# Patient Record
Sex: Male | Born: 1945 | Race: White | Hispanic: No | Marital: Married | Smoking: Never smoker
Health system: Southern US, Community
[De-identification: ages and names within clinical notes are randomized; demographics above are authoritative.]

## PROBLEM LIST (undated history)

## (undated) DIAGNOSIS — R351 Nocturia: Secondary | ICD-10-CM

## (undated) DIAGNOSIS — T66XXXA Radiation sickness, unspecified, initial encounter: Secondary | ICD-10-CM

## (undated) DIAGNOSIS — R339 Retention of urine, unspecified: Secondary | ICD-10-CM

## (undated) DIAGNOSIS — N390 Urinary tract infection, site not specified: Secondary | ICD-10-CM

## (undated) DIAGNOSIS — I10 Essential (primary) hypertension: Secondary | ICD-10-CM

## (undated) DIAGNOSIS — M869 Osteomyelitis, unspecified: Secondary | ICD-10-CM

## (undated) DIAGNOSIS — Z9889 Other specified postprocedural states: Secondary | ICD-10-CM

## (undated) DIAGNOSIS — C61 Malignant neoplasm of prostate: Secondary | ICD-10-CM

## (undated) DIAGNOSIS — Z87442 Personal history of urinary calculi: Secondary | ICD-10-CM

## (undated) DIAGNOSIS — M1990 Unspecified osteoarthritis, unspecified site: Secondary | ICD-10-CM

## (undated) HISTORY — PX: WISDOM TOOTH EXTRACTION: SHX21

## (undated) HISTORY — PX: SUPRAPUBIC CATHETER PLACEMENT: SHX2473

## (undated) HISTORY — PX: OTHER SURGICAL HISTORY: SHX169

## (undated) HISTORY — PX: TRANSURETHRAL RESECTION OF PROSTATE: SHX73

---

## 2006-08-30 HISTORY — PX: RADIOACTIVE SEED IMPLANT: SHX5150

## 2006-08-30 HISTORY — PX: OTHER SURGICAL HISTORY: SHX169

## 2007-03-07 ENCOUNTER — Ambulatory Visit (HOSPITAL_COMMUNITY): Admission: RE | Admit: 2007-03-07 | Discharge: 2007-03-07 | Payer: Self-pay | Admitting: Urology

## 2007-03-14 ENCOUNTER — Ambulatory Visit: Admission: RE | Admit: 2007-03-14 | Discharge: 2007-06-12 | Payer: Self-pay | Admitting: Radiation Oncology

## 2007-06-13 ENCOUNTER — Ambulatory Visit: Admission: RE | Admit: 2007-06-13 | Discharge: 2007-08-28 | Payer: Self-pay | Admitting: Radiation Oncology

## 2007-07-20 ENCOUNTER — Encounter: Admission: RE | Admit: 2007-07-20 | Discharge: 2007-07-20 | Payer: Self-pay | Admitting: General Surgery

## 2007-08-01 ENCOUNTER — Encounter: Admission: RE | Admit: 2007-08-01 | Discharge: 2007-08-01 | Payer: Self-pay | Admitting: Urology

## 2007-08-07 ENCOUNTER — Ambulatory Visit (HOSPITAL_BASED_OUTPATIENT_CLINIC_OR_DEPARTMENT_OTHER): Admission: RE | Admit: 2007-08-07 | Discharge: 2007-08-07 | Payer: Self-pay | Admitting: Urology

## 2007-09-01 ENCOUNTER — Ambulatory Visit: Admission: RE | Admit: 2007-09-01 | Discharge: 2007-09-24 | Payer: Self-pay | Admitting: Radiation Oncology

## 2007-09-05 ENCOUNTER — Encounter: Admission: RE | Admit: 2007-09-05 | Discharge: 2007-09-05 | Payer: Self-pay | Admitting: General Surgery

## 2009-08-30 HISTORY — PX: TOE SURGERY: SHX1073

## 2010-12-08 ENCOUNTER — Other Ambulatory Visit: Payer: Self-pay | Admitting: Urology

## 2010-12-08 DIAGNOSIS — C61 Malignant neoplasm of prostate: Secondary | ICD-10-CM

## 2010-12-21 ENCOUNTER — Encounter (HOSPITAL_COMMUNITY)
Admission: RE | Admit: 2010-12-21 | Discharge: 2010-12-21 | Disposition: A | Discharge status: Home / Self Care | Payer: Managed Care, Other (non HMO) | Source: Ambulatory Visit | Attending: Urology | Admitting: Urology

## 2010-12-21 ENCOUNTER — Encounter (HOSPITAL_COMMUNITY): Payer: Self-pay

## 2010-12-21 DIAGNOSIS — M47817 Spondylosis without myelopathy or radiculopathy, lumbosacral region: Secondary | ICD-10-CM | POA: Insufficient documentation

## 2010-12-21 DIAGNOSIS — M899 Disorder of bone, unspecified: Secondary | ICD-10-CM | POA: Insufficient documentation

## 2010-12-21 DIAGNOSIS — M1610 Unilateral primary osteoarthritis, unspecified hip: Secondary | ICD-10-CM | POA: Insufficient documentation

## 2010-12-21 DIAGNOSIS — C61 Malignant neoplasm of prostate: Secondary | ICD-10-CM

## 2010-12-21 DIAGNOSIS — M25559 Pain in unspecified hip: Secondary | ICD-10-CM | POA: Insufficient documentation

## 2010-12-21 DIAGNOSIS — M169 Osteoarthritis of hip, unspecified: Secondary | ICD-10-CM | POA: Insufficient documentation

## 2010-12-21 DIAGNOSIS — M898X9 Other specified disorders of bone, unspecified site: Secondary | ICD-10-CM | POA: Insufficient documentation

## 2010-12-21 MED ORDER — TECHNETIUM TC 99M MEDRONATE IV KIT
25.0000 | PACK | Freq: Once | INTRAVENOUS | Status: AC | PRN
  Administered 2010-12-21: 24 via INTRAVENOUS

## 2011-01-12 NOTE — Op Note (Signed)
NAMEJamol Stafford, [name]                  ACCOUNT NO.:  [id number]   MEDICAL RECORD NO.:  [id number]          PATIENT TYPE:  AMB   LOCATION:  [facility]                         FACILITY:  [facility]   PHYSICIAN:  Bertram Millard. Dahlstedt, M.D.DATE OF BIRTH:  [birth year]   DATE OF PROCEDURE:  08/07/2007  DATE OF DISCHARGE:                               OPERATIVE REPORT   PREOPERATIVE DIAGNOSIS:  Adenocarcinoma of the prostate, clinical stage  T2C, Gleason 4+4.   POSTOPERATIVE DIAGNOSIS:  Adenocarcinoma of the prostate, clinical stage  T2C, Gleason 4+4.   PROCEDURE:  Placement I-125 brachytherapy/seed implantation.   SURGEON:  Bertram Millard. Dahlstedt, M.D.   RADIATION ONCOLOGIST:  Artist Pais. Kathrynn Running, M.D.   ANESTHESIA:  General with LMA.   COMPLICATIONS:  None.   ESTIMATED BLOOD LOSS:  Minimal.   DISPOSITION:  To PACU.   BRIEF HISTORY:  Kevin Stafford is a 65 year old male with adenocarcinoma of the  prostate.  He first presented in the summer with an elevated PSA of 55.  He had a nodular gland and biopsies revealed cancer mainly on the right  side of his gland, with one small focus on the left.  He had Gleason 8  (4+4).  He chose combination radiotherapy with prolonged administration  of Lupron.  He has completed his external beam radiotherapy and presents  at this time for I-125 brachytherapy.  He understands the procedure as  well as the risks and complications.  He desires to proceed.   DESCRIPTION OF PROCEDURE:  The patient was administered preoperative IV  antibiotics in the holding area, identified there, taken to the  operating room, where a general anesthetic was administered using the  LMA.  He was placed in the dorsal lithotomy position.  A transrectal  ultrasound probe was placed by Dr. Kathrynn Running.  Serial scans were then  taken of the prostate.  The prostate was outlined, the rectum identified  as well as the urethra.  Following this, the brachytherapy template was  placed on the ultrasound stand,  and fixing needles were placed.  Following computation by the Nucletron device, a total of 61 seeds were  placed in the prostate.  Twenty needles were utilized for this.  For  specifics, please see the sonographic planning of oncology treatment  sheet.  Following administration of all seeds, the fixation needles were  removed.  A hard-copy fluoroscopic image was then obtained.  The  catheter was then removed.  The ultrasound probe was removed and the  cystoscopy revealed no evident damage to the  urethra, no damage to the bladder, and no intraurethral or intravesical  seeds.  A minimal amount of water was left in the bladder.  The scope  was removed.  At this point the procedure was terminated.  A dressing  was placed on the patient's perineum.  A catheter was not replaced.  He  was awakened and taken to PACU in stable condition.      Bertram Millard. Dahlstedt, M.D.  Electronically Signed     SMD/MEDQ  D:  08/07/2007  T:  08/07/2007  Job:  [id number]   cc:  Artist Pais Kathrynn Running, M.D.  Fax: [phone]   [facility]

## 2011-02-19 ENCOUNTER — Ambulatory Visit (HOSPITAL_BASED_OUTPATIENT_CLINIC_OR_DEPARTMENT_OTHER)
Admission: RE | Admit: 2011-02-19 | Discharge: 2011-02-19 | Disposition: A | Discharge status: Home / Self Care | Payer: Managed Care, Other (non HMO) | Source: Ambulatory Visit | Attending: Urology | Admitting: Urology

## 2011-02-19 DIAGNOSIS — C61 Malignant neoplasm of prostate: Secondary | ICD-10-CM | POA: Insufficient documentation

## 2011-02-19 DIAGNOSIS — Z01812 Encounter for preprocedural laboratory examination: Secondary | ICD-10-CM | POA: Insufficient documentation

## 2011-02-19 DIAGNOSIS — Z01810 Encounter for preprocedural cardiovascular examination: Secondary | ICD-10-CM | POA: Insufficient documentation

## 2011-02-19 HISTORY — PX: CRYOABLATION: SHX1415

## 2011-02-19 HISTORY — PX: PROSTATE CRYOABLATION: SUR358

## 2011-02-19 LAB — POCT HEMOGLOBIN-HEMACUE: Hemoglobin: 16.7 g/dL (ref 13.0–17.0)

## 2011-05-02 ENCOUNTER — Emergency Department (HOSPITAL_COMMUNITY)
Admission: EM | Admit: 2011-05-02 | Discharge: 2011-05-02 | Disposition: A | Discharge status: Home / Self Care | Payer: Managed Care, Other (non HMO) | Attending: Emergency Medicine | Admitting: Emergency Medicine

## 2011-05-02 ENCOUNTER — Emergency Department (HOSPITAL_COMMUNITY): Payer: Managed Care, Other (non HMO)

## 2011-05-02 DIAGNOSIS — R109 Unspecified abdominal pain: Secondary | ICD-10-CM | POA: Insufficient documentation

## 2011-05-02 DIAGNOSIS — N390 Urinary tract infection, site not specified: Secondary | ICD-10-CM | POA: Insufficient documentation

## 2011-05-02 DIAGNOSIS — Z8546 Personal history of malignant neoplasm of prostate: Secondary | ICD-10-CM | POA: Insufficient documentation

## 2011-05-02 LAB — CBC
HCT: 40.1 % (ref 39.0–52.0)
MCV: 87.9 fL (ref 78.0–100.0)
Platelets: 224 10*3/uL (ref 150–400)
WBC: 9.9 10*3/uL (ref 4.0–10.5)

## 2011-05-02 LAB — POCT I-STAT, CHEM 8
Calcium, Ion: 1.14 mmol/L (ref 1.12–1.32)
Chloride: 109 mEq/L (ref 96–112)
Creatinine, Ser: 2.4 mg/dL — ABNORMAL HIGH (ref 0.50–1.35)
Glucose, Bld: 85 mg/dL (ref 70–99)
Hemoglobin: 13.6 g/dL (ref 13.0–17.0)
Potassium: 4 mEq/L (ref 3.5–5.1)
Sodium: 141 mEq/L (ref 135–145)
TCO2: 22 mmol/L (ref 0–100)

## 2011-05-02 LAB — DIFFERENTIAL
Basophils Relative: 0 % (ref 0–1)
Monocytes Absolute: 0.7 10*3/uL (ref 0.1–1.0)
Monocytes Relative: 7 % (ref 3–12)
Neutrophils Relative %: 88 % — ABNORMAL HIGH (ref 43–77)

## 2011-05-02 LAB — OCCULT BLOOD, POC DEVICE: Fecal Occult Bld: POSITIVE

## 2011-05-02 LAB — URINALYSIS, ROUTINE W REFLEX MICROSCOPIC
Ketones, ur: NEGATIVE mg/dL
pH: 6 (ref 5.0–8.0)

## 2011-05-04 LAB — URINE CULTURE
Colony Count: NO GROWTH
Culture: NO GROWTH

## 2011-06-07 LAB — CBC
HCT: 38 — ABNORMAL LOW
MCHC: 35.2
MCV: 87.8
Platelets: 172
WBC: 4.2

## 2011-06-07 LAB — COMPREHENSIVE METABOLIC PANEL
ALT: 31
Alkaline Phosphatase: 62
Calcium: 9.2
Creatinine, Ser: 0.95
GFR calc Af Amer: >60
Glucose, Bld: 108 — ABNORMAL HIGH
Potassium: 4.2
Sodium: 141

## 2011-06-07 LAB — APTT: aPTT: 26

## 2011-06-07 LAB — PROTIME-INR: INR: 1

## 2011-08-16 ENCOUNTER — Other Ambulatory Visit: Payer: Self-pay | Admitting: Urology

## 2011-09-14 ENCOUNTER — Encounter (HOSPITAL_BASED_OUTPATIENT_CLINIC_OR_DEPARTMENT_OTHER): Payer: Self-pay | Admitting: *Deleted

## 2011-09-14 NOTE — H&P (Signed)
Urology Admission H&P  Chief Complaint: Difficulty urinating  History of Present Illness:   Kevin Stafford is a 66 year old male who presents at this time for anesthetic cystoscopy. He has a history of adenocarcinoma prostate, with high-risk disease which was treated with combination radiotherapy and hormonal ablation in the past. His PSA increased in 2012, and he underwent repeat ultrasound and biopsy. This revealed persistent disease and his prostate. There was no evidence of extraprostatic spread. He underwent cryoablation, performed by Dr. Brunilda Payor in the summer of 2012. He has had significant lower urinary tract symptomatology since that time. Evaluation included urodynamics which revealed fairly small capacity bladder with a very poor urinary flow. Cystoscopy prior to that revealed abnormal findings within the prosthetic urethra. Apparently, I was not able, in the office, to get into his bladder. CT of the abdomen and pelvis revealed no hydronephrosis, which was different than his CT scan in September, 2 months after his cryoablation. That revealed mild right hydronephrosis, which was present when he presented to the emergency room with flank pain. There was no extraprostatic extravasation of contrast on that CT scan.  He presents at this time for anesthetic cystoscopy and possible incision of bladder neck/prostate 2 his voiding.  Past Medical History  Diagnosis Date  . Nocturia   . Recurrent UTI   . Prostate cancer S/P RADIOACTIVE SEED IMPLANTS 2008 AND CRYOABLATION OF PROSTATE 06-222012   Past Surgical History  Procedure Date  . Radioactive seed implant 2008    PROSTATE  . Reduction right hand fx 2008  . Wisdom tooth extraction [place]  . Prostate cryoablation 02-19-2011    Home Medications:  No prescriptions prior to admission   Allergies: No Known Allergies  History reviewed. No pertinent family history. Social History:  reports that he has never smoked. He has never used smokeless  tobacco. He reports that he drinks alcohol. He reports that he does not use illicit drugs.  Review of Systems  Constitutional: Negative.   HENT: Negative.   Eyes: Negative.   Respiratory: Negative.   Cardiovascular: Negative.   Gastrointestinal: Negative.   Genitourinary: Negative.  Negative for hematuria.       Difficult urination. Perineal pain.  Musculoskeletal: Negative.   Skin: Negative.   Neurological: Negative.   Endo/Heme/Allergies: Negative.   Psychiatric/Behavioral: Negative.     Physical Exam:  Vital signs in last 24 hours: Weight:  [90.719 kg (200 lb)] 90.719 kg (200 lb) (01/15 1154) Physical Exam  Constitutional: He is oriented to person, place, and time. He appears well-developed and well-nourished.  HENT:  Head: Normocephalic and atraumatic.  Eyes: Pupils are equal, round, and reactive to light.  Neck: Normal range of motion. Neck supple.  Cardiovascular: Normal rate, regular rhythm and normal heart sounds.   Respiratory: Effort normal and breath sounds normal.  GI: Soft. Bowel sounds are normal.  Genitourinary: Rectum normal and penis normal.  Musculoskeletal: Normal range of motion.  Neurological: He is alert and oriented to person, place, and time.  Skin: Skin is warm and dry.  Psychiatric: He has a normal mood and affect. His behavior is normal.    Laboratory Data:  No results found for this or any previous visit (from the past 24 hour(s)). No results found for this or any previous visit (from the past 240 hour(s)). Creatinine: No results found for this basename: CREATININE:7 in the last 168 hours Baseline Creatinine:   Impression/Assessment:  Adenocarcinoma of the prostate. Status post combination radiotherapy with androgen ablation. He was found to  have recurrence in 2012, and underwent cryoablation in the summer of 2012. He has had difficult urination, feeling of incomplete emptying, and slow urinary stream. There is evidence of significant loss of  prosthetic tissue. He presents at this time for anesthetic cystoscopy, possible TUIP versus TURP.  Plan:  See above  Chelsea Aus 09/14/2011, 9:25 PM

## 2011-09-14 NOTE — Progress Notes (Signed)
NPO AFTER MN. ARRIVES AT 0930. NEEDS HG. CURRENT 02-19-11 W/ CHART.

## 2011-09-15 ENCOUNTER — Encounter (HOSPITAL_BASED_OUTPATIENT_CLINIC_OR_DEPARTMENT_OTHER): Payer: Self-pay | Admitting: *Deleted

## 2011-09-15 ENCOUNTER — Encounter (HOSPITAL_BASED_OUTPATIENT_CLINIC_OR_DEPARTMENT_OTHER)
Admission: RE | Disposition: A | Discharge status: Home / Self Care | Payer: Self-pay | Source: Ambulatory Visit | Attending: Urology

## 2011-09-15 ENCOUNTER — Encounter (HOSPITAL_BASED_OUTPATIENT_CLINIC_OR_DEPARTMENT_OTHER): Payer: Self-pay | Admitting: Certified Registered"

## 2011-09-15 ENCOUNTER — Ambulatory Visit (HOSPITAL_BASED_OUTPATIENT_CLINIC_OR_DEPARTMENT_OTHER)
Admission: RE | Admit: 2011-09-15 | Discharge: 2011-09-15 | Disposition: A | Discharge status: Home / Self Care | Payer: Managed Care, Other (non HMO) | Source: Ambulatory Visit | Attending: Urology | Admitting: Urology

## 2011-09-15 ENCOUNTER — Ambulatory Visit (HOSPITAL_BASED_OUTPATIENT_CLINIC_OR_DEPARTMENT_OTHER): Payer: Managed Care, Other (non HMO) | Admitting: Certified Registered"

## 2011-09-15 DIAGNOSIS — R339 Retention of urine, unspecified: Secondary | ICD-10-CM | POA: Insufficient documentation

## 2011-09-15 DIAGNOSIS — R39198 Other difficulties with micturition: Secondary | ICD-10-CM | POA: Insufficient documentation

## 2011-09-15 DIAGNOSIS — C61 Malignant neoplasm of prostate: Secondary | ICD-10-CM | POA: Insufficient documentation

## 2011-09-15 DIAGNOSIS — Z923 Personal history of irradiation: Secondary | ICD-10-CM | POA: Insufficient documentation

## 2011-09-15 HISTORY — DX: Urinary tract infection, site not specified: N39.0

## 2011-09-15 HISTORY — DX: Malignant neoplasm of prostate: C61

## 2011-09-15 HISTORY — PX: CYSTOSCOPY: SHX5120

## 2011-09-15 HISTORY — DX: Nocturia: R35.1

## 2011-09-15 SURGERY — CYSTOSCOPY
Anesthesia: General | Site: Ureter | Wound class: Clean Contaminated

## 2011-09-15 MED ORDER — OXYCODONE-ACETAMINOPHEN 5-325 MG PO TABS
1.0000 | ORAL_TABLET | ORAL | Status: DC | PRN
  Administered 2011-09-15: 2 via ORAL

## 2011-09-15 MED ORDER — CEFAZOLIN SODIUM-DEXTROSE 2-3 GM-% IV SOLR
2.0000 g | INTRAVENOUS | Status: AC
  Administered 2011-09-15: 2 g via INTRAVENOUS

## 2011-09-15 MED ORDER — SODIUM CHLORIDE 0.9 % IR SOLN: Status: DC | PRN

## 2011-09-15 MED ORDER — PROMETHAZINE HCL 25 MG/ML IJ SOLN: 6.2500 mg | INTRAMUSCULAR | Status: DC | PRN

## 2011-09-15 MED ORDER — PROPOFOL 10 MG/ML IV EMUL
INTRAVENOUS | Status: DC | PRN
  Administered 2011-09-15: 200 mg via INTRAVENOUS

## 2011-09-15 MED ORDER — CEFAZOLIN SODIUM 1-5 GM-% IV SOLN: 1.0000 g | INTRAVENOUS | Status: DC

## 2011-09-15 MED ORDER — LIDOCAINE HCL (CARDIAC) 20 MG/ML IV SOLN
INTRAVENOUS | Status: DC | PRN
  Administered 2011-09-15: 100 mg via INTRAVENOUS

## 2011-09-15 MED ORDER — FENTANYL CITRATE 0.05 MG/ML IJ SOLN: 25.0000 ug | INTRAMUSCULAR | Status: DC | PRN

## 2011-09-15 MED ORDER — LACTATED RINGERS IV SOLN
INTRAVENOUS | Status: DC
  Administered 2011-09-15: 100 mL/h via INTRAVENOUS
  Administered 2011-09-15: 14:00:00 via INTRAVENOUS

## 2011-09-15 MED ORDER — DEXAMETHASONE SODIUM PHOSPHATE 4 MG/ML IJ SOLN
INTRAMUSCULAR | Status: DC | PRN
  Administered 2011-09-15: 8 mg via INTRAVENOUS

## 2011-09-15 MED ORDER — STERILE WATER FOR IRRIGATION IR SOLN
Status: DC | PRN
  Administered 2011-09-15: 3000 mL

## 2011-09-15 MED ORDER — BELLADONNA ALKALOIDS-OPIUM 16.2-60 MG RE SUPP
RECTAL | Status: DC | PRN
  Administered 2011-09-15: 1 via RECTAL

## 2011-09-15 MED ORDER — MIDAZOLAM HCL 5 MG/5ML IJ SOLN
INTRAMUSCULAR | Status: DC | PRN
  Administered 2011-09-15: 2 mg via INTRAVENOUS

## 2011-09-15 MED ORDER — FENTANYL CITRATE 0.05 MG/ML IJ SOLN
INTRAMUSCULAR | Status: DC | PRN
  Administered 2011-09-15 (×2): 25 ug via INTRAVENOUS
  Administered 2011-09-15 (×2): 50 ug via INTRAVENOUS
  Administered 2011-09-15 (×2): 25 ug via INTRAVENOUS
  Administered 2011-09-15: 50 ug via INTRAVENOUS

## 2011-09-15 MED ORDER — DIATRIZOATE MEGLUMINE 30 % UR SOLN
URETHRAL | Status: DC | PRN
  Administered 2011-09-15: 300 mL

## 2011-09-15 SURGICAL SUPPLY — 41 items
BAG DRAIN URO-CYSTO SKYTR STRL (DRAIN) ×3 IMPLANT
BAG URINE DRAINAGE (UROLOGICAL SUPPLIES) IMPLANT
BAG URINE LEG 19OZ MD ST LTX (BAG) IMPLANT
CANISTER SUCT LVC 12 LTR MEDI- (MISCELLANEOUS) ×3 IMPLANT
CATH COUDE FOLEY 2W 5CC 18FR (CATHETERS) ×3 IMPLANT
CATH FOLEY 2WAY SLVR  5CC 20FR (CATHETERS) ×1
CATH FOLEY 2WAY SLVR  5CC 22FR (CATHETERS)
CATH FOLEY 2WAY SLVR 30CC 22FR (CATHETERS) IMPLANT
CATH FOLEY 2WAY SLVR 5CC 20FR (CATHETERS) ×2 IMPLANT
CATH FOLEY 2WAY SLVR 5CC 22FR (CATHETERS) IMPLANT
CATH FOLEY 3WAY 30CC 22F (CATHETERS) IMPLANT
CATH HEMA 3WAY 30CC 24FR COUDE (CATHETERS) IMPLANT
CATH HEMA 3WAY 30CC 24FR RND (CATHETERS) IMPLANT
CLOTH BEACON ORANGE TIMEOUT ST (SAFETY) ×3 IMPLANT
DRAPE CAMERA CLOSED 9X96 (DRAPES) ×3 IMPLANT
ELECT BUTTON BIOP 24F 90D PLAS (MISCELLANEOUS) IMPLANT
ELECT BUTTON HF 24-28F 2 30DE (ELECTRODE) IMPLANT
ELECT LOOP HF 26F 30D .35MM (CUTTING LOOP) IMPLANT
ELECT NEEDLE 45D HF 24-28F 12D (CUTTING LOOP) IMPLANT
ELECT REM PT RETURN 9FT ADLT (ELECTROSURGICAL) ×3
ELECTRODE REM PT RTRN 9FT ADLT (ELECTROSURGICAL) ×2 IMPLANT
EVACUATOR MICROVAS BLADDER (UROLOGICAL SUPPLIES) IMPLANT
GLOVE BIO SURGEON STRL SZ8 (GLOVE) ×3 IMPLANT
GLOVE BIOGEL PI IND STRL 6.5 (GLOVE) ×2 IMPLANT
GLOVE BIOGEL PI INDICATOR 6.5 (GLOVE) ×1
GLOVE ECLIPSE 6.5 STRL STRAW (GLOVE) ×3 IMPLANT
GOWN STRL REIN XL XLG (GOWN DISPOSABLE) ×3 IMPLANT
GOWN SURGICAL LARGE (GOWNS) ×6 IMPLANT
GOWN XL W/COTTON TOWEL STD (GOWNS) ×3 IMPLANT
GUIDEWIRE ANG ZIPWIRE 038X150 (WIRE) ×3 IMPLANT
GUIDEWIRE STR DUAL SENSOR (WIRE) ×3 IMPLANT
HOLDER FOLEY CATH W/STRAP (MISCELLANEOUS) IMPLANT
KIT ASPIRATION TUBING (SET/KITS/TRAYS/PACK) IMPLANT
LOOP CUTTING 24FR OLYMPUS (CUTTING LOOP) IMPLANT
NDL SAFETY ECLIPSE 18X1.5 (NEEDLE) ×2 IMPLANT
NEEDLE HYPO 18GX1.5 SHARP (NEEDLE) ×1
PACK CYSTOSCOPY (CUSTOM PROCEDURE TRAY) ×3 IMPLANT
PLUG CATH AND CAP STER (CATHETERS) IMPLANT
SYR 30ML LL (SYRINGE) IMPLANT
SYRINGE IRR TOOMEY STRL 70CC (SYRINGE) ×3 IMPLANT
WATER STERILE IRR 3000ML UROMA (IV SOLUTION) ×6 IMPLANT

## 2011-09-15 NOTE — Anesthesia Postprocedure Evaluation (Signed)
  Anesthesia Post-op Note  Patient: Kevin Stafford  Procedure(s) Performed:  CYSTOSCOPY - cystogram  Patient Location: PACU  Anesthesia Type: General  Level of Consciousness: oriented and sedated  Airway and Oxygen Therapy: Patient Spontanous Breathing and Patient connected to nasal cannula oxygen  Post-op Pain: mild  Post-op Assessment: Post-op Vital signs reviewed, Patient's Cardiovascular Status Stable, Respiratory Function Stable and Patent Airway  Post-op Vital Signs: stable  Complications: No apparent anesthesia complications

## 2011-09-15 NOTE — Anesthesia Procedure Notes (Signed)
Procedure Name: LMA Insertion Date/Time: 09/15/2011 10:58 AM Performed by: Renella Cunas D Pre-anesthesia Checklist: Patient identified, Emergency Drugs available, Suction available and Patient being monitored Patient Re-evaluated:Patient Re-evaluated prior to inductionOxygen Delivery Method: Circle System Utilized Preoxygenation: Pre-oxygenation with 100% oxygen Intubation Type: IV induction Ventilation: Mask ventilation without difficulty LMA: LMA inserted LMA Size: 4.0 Number of attempts: 1 Airway Equipment and Method: bite block Placement Confirmation: positive ETCO2 Tube secured with: Tape Dental Injury: Teeth and Oropharynx as per pre-operative assessment

## 2011-09-15 NOTE — Progress Notes (Signed)
Received report as caregiver. 

## 2011-09-15 NOTE — Op Note (Signed)
Preoperative diagnosis: History of prostate cancer, status post radiotherapy and salvage cryoablation. Significant voiding dysfunction. Postoperative diagnosis: Same  Procedure: Anesthetic cystoscopy, cystogram Surgeon: Bertram Millard. Jeni Duling, M.D.  Anesthesia: Gen. With LMA Indications: This 66 year old gentleman comes today for cystoscopy. He has a history of radiotherapy for his prostate cancer several years ago, followed by salvage cryoablation in July of 2012. He has had significant voiding dysfunction since that time. Recent evaluation included urodynamics which revealed a small capacity bladder with a very poor urinary flow. Additionally, there is evidence of significant prostatic fossa with some contiguity with the bladder. Cystoscopy in the office revealed only necrotic tissue, and I do not think I negotiated the scope into the bladder. CT scan reveals a draining bladder, but a thickened bladder base.  He presents at this time for cystoscopy, and if present, treatment of the bladder neck contracture.     Technique and findings: The patient was identified in the holding area and received preoperative IV antibiotics. He was taken to the operating room where general anesthetic was administered with the LMA. He was placed in the dorsolithotomy position. Genitalia and perineum were prepped and draped. Time out was then performed. A 22 French cystoscope was advanced through his urethra under direct vision. The anterior urethra was normal. There was adequate coaptation of the urethra at the membranous area. Once within the prostatic urethra, there was loss of landmarks, and a significant amount of white, fluffy tissue circumferentially surrounding the prostatic fossa. Because I was unable to see the bladder neck, I then removed the scope, and tried placing an 16 French coud tip catheter. The catheter was negotiated as far as I could, and Cystografin was injected. It was evident that the catheter was  curled up within his prostatic urethra. There was some egress of the contrast and was bladder, but the majority of the contrast was within the prosthetic urethra. There did not seem to be any extravasation of the contrast in the surrounding tissue. I then removed the catheter, and passed the cystoscope again. It was quite difficult to see anything but the "fluffy" tissue surrounding the prostatic urethra. Despite having the water irrigation pressure, I was still unable to negotiate the bladder neck. I used a glide wire to attempt to negotiate the guidewire into the bladder through the bladder neck blindly. Unfortunately, even with fluoroscopic guidance, I was unable to do this. After approximately 20 minutes of surveying the prostatic urethra, I then backed the cystoscope up, and a bit more anteriorly. There was evidence that there was some normal urothelium in this area. I then advanced the scope slightly, and found that I was within the bladder. At this point, I passed a guidewire through the cystoscope, guaranteeing access in the future to the bladder. The bladder was inspected circumferentially. The urothelium was smooth, but there were evidence of small hemorrhagic areas consistent with prior radiation. I saw no tumors. I saw no significant trabeculations. The bladder base was somewhat irregular, and I was unable to see the ureteral orifices. I backed the cystoscope up, and observe the bladder neck. It was obvious at this point that the bladder neck was approximately twice as big as the cystoscope beak, and that there was no significant contracture. It was evident that the prosthetic fossa was underlying the bladder neck posteriorly. I did see a few iodine seeds loose within the prostatic fossa. These were not removed, but I tried to irrigate them out unsuccessfully. After inspecting the bladder neck, and feeling that  he did not need to have any incision of the bladder neck, I then removed the cystoscope. With  the guidewire in place, I advanced a 20 French Foley catheter over top the guidewire into the bladder. Proper catheter positioning was checked by injecting Cystografin through the catheter once a guidewire was removed. There was obvious filling of the bladder and up the prostatic fossa. At this point the balloon was filled, and the catheter left to drainage.The patient tolerated the procedure well. He was awakened and taken to the PACU in stable condition.

## 2011-09-15 NOTE — Progress Notes (Signed)
Glasses returned to patient.

## 2011-09-15 NOTE — Anesthesia Preprocedure Evaluation (Signed)
Anesthesia Evaluation  Patient identified by MRN, date of birth, ID band Patient awake    Reviewed: Allergy & Precautions, H&P , NPO status , Patient's Chart, lab work & pertinent test results, reviewed documented beta blocker date and time   Airway Mallampati: II TM Distance: >3 FB Neck ROM: Full    Dental  (+) Teeth Intact   Pulmonary neg pulmonary ROS,  clear to auscultation        Cardiovascular neg cardio ROS Regular Normal Denies cardiac symptoms   Neuro/Psych Negative Neurological ROS  Negative Psych ROS   GI/Hepatic negative GI ROS, Neg liver ROS,   Endo/Other  Negative Endocrine ROS  Renal/GU negative Renal ROS   Prostate cancer    Musculoskeletal negative musculoskeletal ROS (+)   Abdominal   Peds negative pediatric ROS (+)  Hematology negative hematology ROS (+)   Anesthesia Other Findings   Reproductive/Obstetrics negative OB ROS                           Anesthesia Physical  Anesthesia Plan  ASA: II  Anesthesia Plan: General   Post-op Pain Management:    Induction: Intravenous  Airway Management Planned: LMA  Additional Equipment:   Intra-op Plan:   Post-operative Plan: Extubation in OR  Informed Consent:   Plan Discussed with: CRNA and Surgeon  Anesthesia Plan Comments:         Anesthesia Quick Evaluation  

## 2011-09-15 NOTE — Transfer of Care (Signed)
Immediate Anesthesia Transfer of Care Note  Patient: Kevin Stafford  Procedure(s) Performed:  CYSTOSCOPY - cystogram  Patient Location: Patient transported to PACU with oxygen via face mask at 6 Liters / Min  Anesthesia Type: General  Level of Consciousness: awake and alert   Airway & Oxygen Therapy: Patient Spontanous Breathing and Patient connected to face mask oxygen Post-op Assessment: Report given to PACU RN and Post -op Vital signs reviewed and stable  Post vital signs: Reviewed and stable  Complications: No apparent anesthesia complications  Filed Vitals:   09/15/11 1158  BP: 141/87  Pulse: 80  Temp: 36.1 C  Resp: 17

## 2011-09-16 ENCOUNTER — Encounter (HOSPITAL_BASED_OUTPATIENT_CLINIC_OR_DEPARTMENT_OTHER): Payer: Self-pay | Admitting: Urology

## 2011-10-04 ENCOUNTER — Other Ambulatory Visit: Payer: Self-pay | Admitting: Urology

## 2011-10-05 ENCOUNTER — Encounter (HOSPITAL_BASED_OUTPATIENT_CLINIC_OR_DEPARTMENT_OTHER): Payer: Self-pay | Admitting: *Deleted

## 2011-10-05 NOTE — Progress Notes (Signed)
To [facility]  at 0815.NPO after MN-Hg on arrival.  Ekg with epic chart.

## 2011-10-11 ENCOUNTER — Ambulatory Visit (HOSPITAL_BASED_OUTPATIENT_CLINIC_OR_DEPARTMENT_OTHER): Payer: Managed Care, Other (non HMO) | Admitting: Anesthesiology

## 2011-10-11 ENCOUNTER — Encounter (HOSPITAL_BASED_OUTPATIENT_CLINIC_OR_DEPARTMENT_OTHER)
Admission: RE | Disposition: A | Discharge status: Home / Self Care | Payer: Self-pay | Source: Ambulatory Visit | Attending: Urology

## 2011-10-11 ENCOUNTER — Encounter (HOSPITAL_BASED_OUTPATIENT_CLINIC_OR_DEPARTMENT_OTHER): Payer: Self-pay | Admitting: Anesthesiology

## 2011-10-11 ENCOUNTER — Ambulatory Visit (HOSPITAL_BASED_OUTPATIENT_CLINIC_OR_DEPARTMENT_OTHER)
Admission: RE | Admit: 2011-10-11 | Discharge: 2011-10-11 | Disposition: A | Discharge status: Home / Self Care | Payer: Managed Care, Other (non HMO) | Source: Ambulatory Visit | Attending: Urology | Admitting: Urology

## 2011-10-11 ENCOUNTER — Encounter (HOSPITAL_BASED_OUTPATIENT_CLINIC_OR_DEPARTMENT_OTHER): Payer: Self-pay | Admitting: *Deleted

## 2011-10-11 DIAGNOSIS — R351 Nocturia: Secondary | ICD-10-CM | POA: Insufficient documentation

## 2011-10-11 DIAGNOSIS — R300 Dysuria: Secondary | ICD-10-CM | POA: Insufficient documentation

## 2011-10-11 DIAGNOSIS — N3289 Other specified disorders of bladder: Secondary | ICD-10-CM | POA: Insufficient documentation

## 2011-10-11 DIAGNOSIS — C61 Malignant neoplasm of prostate: Secondary | ICD-10-CM

## 2011-10-11 DIAGNOSIS — R350 Frequency of micturition: Secondary | ICD-10-CM | POA: Insufficient documentation

## 2011-10-11 DIAGNOSIS — R339 Retention of urine, unspecified: Secondary | ICD-10-CM | POA: Insufficient documentation

## 2011-10-11 HISTORY — PX: CYSTOSCOPY: SHX5120

## 2011-10-11 HISTORY — DX: Retention of urine, unspecified: R33.9

## 2011-10-11 LAB — POCT HEMOGLOBIN-HEMACUE: Hemoglobin: 13.9 g/dL (ref 13.0–17.0)

## 2011-10-11 SURGERY — INSERTION, SUPRAPUBIC CATHETER
Anesthesia: General | Site: Bladder

## 2011-10-11 MED ORDER — MIDAZOLAM HCL 5 MG/5ML IJ SOLN
INTRAMUSCULAR | Status: DC | PRN
  Administered 2011-10-11: 2 mg via INTRAVENOUS

## 2011-10-11 MED ORDER — KETOROLAC TROMETHAMINE 30 MG/ML IJ SOLN
30.0000 mg | Freq: Once | INTRAMUSCULAR | Status: AC
  Administered 2011-10-11: 30 mg via INTRAVENOUS

## 2011-10-11 MED ORDER — CEFAZOLIN SODIUM 1-5 GM-% IV SOLN: 1.0000 g | INTRAVENOUS | Status: DC

## 2011-10-11 MED ORDER — ONDANSETRON HCL 4 MG/2ML IJ SOLN
INTRAMUSCULAR | Status: DC | PRN
  Administered 2011-10-11: 4 mg via INTRAVENOUS

## 2011-10-11 MED ORDER — FENTANYL CITRATE 0.05 MG/ML IJ SOLN
INTRAMUSCULAR | Status: DC | PRN
  Administered 2011-10-11 (×3): 50 ug via INTRAVENOUS
  Administered 2011-10-11 (×2): 25 ug via INTRAVENOUS
  Administered 2011-10-11 (×2): 50 ug via INTRAVENOUS

## 2011-10-11 MED ORDER — PROMETHAZINE HCL 25 MG/ML IJ SOLN: 12.5000 mg | Freq: Four times a day (QID) | INTRAMUSCULAR | Status: DC | PRN

## 2011-10-11 MED ORDER — BELLADONNA ALKALOIDS-OPIUM 16.2-60 MG RE SUPP
1.0000 | Freq: Once | RECTAL | Status: AC
  Administered 2011-10-11: 1 via RECTAL

## 2011-10-11 MED ORDER — FENTANYL CITRATE 0.05 MG/ML IJ SOLN
25.0000 ug | INTRAMUSCULAR | Status: DC | PRN
  Administered 2011-10-11 (×2): 25 ug via INTRAVENOUS

## 2011-10-11 MED ORDER — STERILE WATER FOR IRRIGATION IR SOLN
Status: DC | PRN
  Administered 2011-10-11: 3000 mL

## 2011-10-11 MED ORDER — LACTATED RINGERS IV SOLN
INTRAVENOUS | Status: DC
  Administered 2011-10-11 (×2): via INTRAVENOUS

## 2011-10-11 MED ORDER — LIDOCAINE HCL (CARDIAC) 20 MG/ML IV SOLN
INTRAVENOUS | Status: DC | PRN
  Administered 2011-10-11: 100 mg via INTRAVENOUS

## 2011-10-11 MED ORDER — OXYCODONE HCL 5 MG PO TABS: 5.0000 mg | ORAL_TABLET | ORAL | Status: DC | PRN

## 2011-10-11 MED ORDER — ACETAMINOPHEN 325 MG PO TABS: 650.0000 mg | ORAL_TABLET | ORAL | Status: DC | PRN

## 2011-10-11 MED ORDER — PROPOFOL 10 MG/ML IV EMUL
INTRAVENOUS | Status: DC | PRN
  Administered 2011-10-11: 230 mg via INTRAVENOUS

## 2011-10-11 MED ORDER — ACETAMINOPHEN 650 MG RE SUPP: 650.0000 mg | RECTAL | Status: DC | PRN

## 2011-10-11 MED ORDER — DEXAMETHASONE SODIUM PHOSPHATE 4 MG/ML IJ SOLN
INTRAMUSCULAR | Status: DC | PRN
  Administered 2011-10-11: 10 mg via INTRAVENOUS

## 2011-10-11 MED ORDER — LACTATED RINGERS IV SOLN
INTRAVENOUS | Status: DC
  Administered 2011-10-11: 12:00:00 via INTRAVENOUS

## 2011-10-11 MED ORDER — PROMETHAZINE HCL 25 MG/ML IJ SOLN: 6.2500 mg | INTRAMUSCULAR | Status: DC | PRN

## 2011-10-11 MED ORDER — OXYCODONE-ACETAMINOPHEN 5-325 MG PO TABS
1.0000 | ORAL_TABLET | Freq: Once | ORAL | Status: AC
  Administered 2011-10-11: 1 via ORAL

## 2011-10-11 MED ORDER — MEPERIDINE HCL 25 MG/ML IJ SOLN: 6.2500 mg | INTRAMUSCULAR | Status: DC | PRN

## 2011-10-11 MED ORDER — CEFAZOLIN SODIUM-DEXTROSE 2-3 GM-% IV SOLR
2.0000 g | INTRAVENOUS | Status: AC
  Administered 2011-10-11: 2 g via INTRAVENOUS

## 2011-10-11 MED ORDER — ONDANSETRON HCL 4 MG/2ML IJ SOLN: 4.0000 mg | Freq: Four times a day (QID) | INTRAMUSCULAR | Status: DC | PRN

## 2011-10-11 SURGICAL SUPPLY — 28 items
BAG DRAIN URO-CYSTO SKYTR STRL (DRAIN) ×3 IMPLANT
BLADE SURG 15 STRL LF DISP TIS (BLADE) ×2 IMPLANT
BLADE SURG 15 STRL SS (BLADE) ×1
CANISTER SUCT LVC 12 LTR MEDI- (MISCELLANEOUS) ×3 IMPLANT
CATH FOLEY 2WAY SLVR  5CC 16FR (CATHETERS)
CATH FOLEY 2WAY SLVR  5CC 18FR (CATHETERS) ×1
CATH FOLEY 2WAY SLVR 5CC 16FR (CATHETERS) IMPLANT
CATH FOLEY 2WAY SLVR 5CC 18FR (CATHETERS) ×2 IMPLANT
CLOTH BEACON ORANGE TIMEOUT ST (SAFETY) ×3 IMPLANT
DRAPE CAMERA CLOSED 9X96 (DRAPES) ×3 IMPLANT
ELECT REM PT RETURN 9FT ADLT (ELECTROSURGICAL) ×3
ELECTRODE REM PT RTRN 9FT ADLT (ELECTROSURGICAL) ×2 IMPLANT
GAUZE SPONGE 4X4 12PLY STRL LF (GAUZE/BANDAGES/DRESSINGS) ×3 IMPLANT
GLOVE BIO SURGEON STRL SZ8 (GLOVE) ×3 IMPLANT
GLOVE ECLIPSE 6.0 STRL STRAW (GLOVE) ×6 IMPLANT
GLOVE INDICATOR 6.5 STRL GRN (GLOVE) ×3 IMPLANT
GOWN PREVENTION PLUS LG XLONG (DISPOSABLE) ×3 IMPLANT
GOWN STRL REIN XL XLG (GOWN DISPOSABLE) ×3 IMPLANT
GOWN XL W/COTTON TOWEL STD (GOWNS) ×3 IMPLANT
HOLDER FOLEY CATH W/STRAP (MISCELLANEOUS) ×3 IMPLANT
NEEDLE HYPO 22GX1.5 SAFETY (NEEDLE) IMPLANT
NS IRRIG 500ML POUR BTL (IV SOLUTION) ×3 IMPLANT
PACK CYSTOSCOPY (CUSTOM PROCEDURE TRAY) ×3 IMPLANT
SUT SILK 2 0 30  PSL (SUTURE) ×1
SUT SILK 2 0 30 PSL (SUTURE) ×2 IMPLANT
SYRINGE IRR TOOMEY STRL 70CC (SYRINGE) ×3 IMPLANT
TAPE CLOTH SURG 6X10 WHT LF (GAUZE/BANDAGES/DRESSINGS) ×3 IMPLANT
WATER STERILE IRR 3000ML UROMA (IV SOLUTION) ×3 IMPLANT

## 2011-10-11 NOTE — Anesthesia Postprocedure Evaluation (Signed)
  Anesthesia Post-op Note  Patient: Kevin Stafford  Procedure(s) Performed:  INSERTION OF SUPRAPUBIC CATHETER - Placement of SP Tube  NO C-ARM Needed; CYSTOSCOPY  Patient Location: PACU  Anesthesia Type: General  Level of Consciousness: awake and alert   Airway and Oxygen Therapy: Patient Spontanous Breathing  Post-op Pain: mild  Post-op Assessment: Post-op Vital signs reviewed, Patient's Cardiovascular Status Stable, Respiratory Function Stable, Patent Airway and No signs of Nausea or vomiting  Post-op Vital Signs: stable  Complications: No apparent anesthesia complications

## 2011-10-11 NOTE — Transfer of Care (Signed)
Immediate Anesthesia Transfer of Care Note  Patient: Kevin Stafford  Procedure(s) Performed:  INSERTION OF SUPRAPUBIC CATHETER - Placement of SP Tube  NO C-ARM Needed; CYSTOSCOPY  Patient Location: PACU  Anesthesia Type: General  Level of Consciousness: drowsy, responds to name, follows commands  Airway & Oxygen Therapy: Patient Spontanous Breathing and Patient connected to face mask oxygen  Post-op Assessment: Report given to PACU RN and Post -op Vital signs reviewed and stable  Post vital signs: Reviewed and stable  Complications: No apparent anesthesia complications

## 2011-10-11 NOTE — Anesthesia Preprocedure Evaluation (Signed)
Anesthesia Evaluation  Patient identified by MRN, date of birth, ID band Patient awake    Reviewed: Allergy & Precautions, H&P , NPO status , Patient's Chart, lab work & pertinent test results, reviewed documented beta blocker date and time   Airway Mallampati: II TM Distance: >3 FB Neck ROM: Full    Dental  (+) Teeth Intact   Pulmonary neg pulmonary ROS,  clear to auscultation        Cardiovascular neg cardio ROS Regular Normal Denies cardiac symptoms   Neuro/Psych Negative Neurological ROS  Negative Psych ROS   GI/Hepatic negative GI ROS, Neg liver ROS,   Endo/Other  Negative Endocrine ROS  Renal/GU negative Renal ROS   Prostate cancer    Musculoskeletal negative musculoskeletal ROS (+)   Abdominal   Peds negative pediatric ROS (+)  Hematology negative hematology ROS (+)   Anesthesia Other Findings   Reproductive/Obstetrics negative OB ROS                           Anesthesia Physical  Anesthesia Plan  ASA: II  Anesthesia Plan: General   Post-op Pain Management:    Induction: Intravenous  Airway Management Planned: LMA  Additional Equipment:   Intra-op Plan:   Post-operative Plan: Extubation in OR  Informed Consent:   Plan Discussed with: CRNA and Surgeon  Anesthesia Plan Comments:         Anesthesia Quick Evaluation

## 2011-10-11 NOTE — H&P (Signed)
Urology Admission H&P  Chief Complaint: Difficulty urinating  History of Present Illness: Kevin Stafford is a 66 year old male with history of adenocarcinoma of the prostate, high risk category. He is status post radiotherapy approximately 3 years ago, with a bump in his PSA and a finding of recurrent disease in 2012. He underwent salvage cryoablation in June, 2012. Since that time, he has had difficulty urinating, and has had recent evidence of urethral/prostatic slough. He has had recent cystoscopy and urodynamics. This revealed a cavity where his prostate was, with no evidence of bladder neck obstruction. Despite this, the patient has had a difficult time after his catheter was removed. Because of the need for bladder drainage at this point, an incomplete bladder emptying, I recommended we place a suprapubic tube until his active prostatic process after his cryoablation has settled down. He presents at this time for Past Medical History  Diagnosis Date  . Nocturia   . Recurrent UTI   . Prostate cancer S/P RADIOACTIVE SEED IMPLANTS 2008 AND CRYOABLATION OF PROSTATE 06-222012  . Bladder retention of urine     foley since  09/27/11   Past Surgical History  Procedure Date  . Radioactive seed implant 2008    PROSTATE  . Reduction right hand fx 2008  . Wisdom tooth extraction IN [place]  . Prostate cryoablation 02-19-2011  . Cystoscopy 09/15/2011    Procedure: CYSTOSCOPY;  Surgeon: Marcine Matar, MD;  Location: [facility];  Service: Urology;;  cystogram    Home Medications:  No prescriptions prior to admission   Allergies: No Known Allergies  History reviewed. No pertinent family history. Social History:  reports that he has never smoked. He has never used smokeless tobacco. He reports that he drinks alcohol. He reports that he does not use illicit drugs.  Review of Systems  Constitutional: Negative.   HENT: Negative.   Eyes: Negative.   Respiratory: Negative.     Cardiovascular: Negative.   Gastrointestinal: Negative.   Genitourinary: Positive for dysuria and frequency.       He also has urinary retention, occasional cloudy urine.  Musculoskeletal: Positive for joint pain.  Skin: Negative.   Endo/Heme/Allergies: Negative.   All other systems reviewed and are negative.    Physical Exam:  Vital signs in last 24 hours:   Physical Exam  Constitutional: He appears well-developed and well-nourished.  HENT:  Head: Normocephalic and atraumatic.  Eyes: Pupils are equal, round, and reactive to light.  Cardiovascular: Normal rate.   Respiratory: Effort normal and breath sounds normal.  GI: Soft.  Genitourinary: Prostate normal and penis normal.  Musculoskeletal: Normal range of motion.  Neurological: He is alert.  Skin: Skin is warm.  Psychiatric: He has a normal mood and affect.    Laboratory Data:  No results found for this or any previous visit (from the past 24 hour(s)). No results found for this or any previous visit (from the past 240 hour(s)). Creatinine: No results found for this basename: CREATININE:7 in the last 168 hours Baseline Creatinine:   Impression/Assessment:  1. Incomplete emptying of bladder. This is secondary to, more likely, combination of noncompliant bladder with some evidence of bladder outlet/prosthetic obstruction.  2. Adenocarcinoma prostate. Status post radiotherapy and salvage cryoablation.  Plan:  At this point, he presents for cystoscopy and placement of suprapubic tube. I discussed the procedure with med and his wife. He desires to proceed.  Marcine Matar M 10/11/2011, 7:26 AM

## 2011-10-11 NOTE — Op Note (Signed)
Preoperative diagnosis: Inadequate bladder emptying Postoperative diagnosis: Same Procedure: Cystoscopy, placement of suprapubic tube Surgeon: Thersea Manfredonia Drains: 20 French council tip catheter Complications: None Anesthesia: Gen. with LMA  Indications: 66 year-old male with poor bladder emptying/retention, status post radiotherapy with salvage cryoablation. Cryoablation was performed approximately 8 months ago. He has had poor bladder emptying, intermittent retention, and at this point presents for suprapubic tube placement. He is aware of the risks and complications of the procedure. These include but are not limited to bleeding,: Injury, infection, among others. He understands these and desires to proceed.  Procedure: The patient was identified in the holding area and received preoperative IV antibiotics. He was taken the operating room where general anesthetic was administered with the LMA. He was placed in the dorsolithotomy position. Genitalia, perineum and lower abdomen were prepped and draped. Time out was performed.  The procedure commenced. A 22 French panendoscope was used to inspect the urethra which was normal. The prosthetic urethra was open, but there was significant necrotic tissue. The bladder was entered through the bladder neck which was enveloped by the necrotic tissue. The bladder was inspected. I did not see the ureteral orifices, but it was evident that the urothelium was somewhat erythematous. No tumors were seen. There were no stones. I filled the bladder with approximately 200 cc of saline. The bladder was somewhat diminished in capacity. I placed a Lowsley retractor, which easily passed into what I thought was the bladder. I used anterior traction to press up against the posterior abdominal wall. I felt that the retractor was adequately positioned. To verify this, I removed the retractor, and again cystoscoped the patient. There was a slightly erythematous area anteriorly where I  felt that there was minor trauma from the Lowsley retractor. The bladder was again filled, and I again placed a Lowsley retractor. I pressed anteriorly up against the anterior bladder wall/posterior abdominal wall. A cutdown over this with the Bovie. I was unable to deliver the tip of the retractor through the 1/2 cm abdominal incision. The 20 [place] council tip catheter was grasped and brought through the incision into the bladder. 10 cc of water was placed in the balloon once I thought that the catheter was adequately positioned. I then used a cystoscope to verify position of the catheter tip as well as a balloon. I then placed another 10 cc of water in the balloon, filling the balloon with 20 cc of water altogether. The incision was closed on either side of the catheter with mattress sutures of 2-0 silk. I then used a silk to secure the catheter as well. The catheter irrigated quite easily, although with slight blood. The catheter was placed to dependent drainage.  The patient tolerated procedure well. Sponge needle and instrument counts were correct. He was then taken to the PACU in stable condition.

## 2011-10-11 NOTE — Anesthesia Procedure Notes (Signed)
Procedure Name: LMA Insertion Date/Time: 10/11/2011 9:28 AM Performed by: Huel Coventry Pre-anesthesia Checklist: Patient identified, Emergency Drugs available, Suction available and Patient being monitored Patient Re-evaluated:Patient Re-evaluated prior to inductionOxygen Delivery Method: Circle System Utilized Preoxygenation: Pre-oxygenation with 100% oxygen Intubation Type: IV induction Ventilation: Mask ventilation without difficulty LMA: LMA inserted LMA Size: 5.0 Number of attempts: 1 Airway Equipment and Method: bite block Placement Confirmation: positive ETCO2 Tube secured with: Tape Dental Injury: Teeth and Oropharynx as per pre-operative assessment

## 2011-10-12 ENCOUNTER — Encounter (HOSPITAL_BASED_OUTPATIENT_CLINIC_OR_DEPARTMENT_OTHER): Payer: Self-pay | Admitting: Urology

## 2011-11-11 ENCOUNTER — Other Ambulatory Visit: Payer: Self-pay | Admitting: Urology

## 2011-11-11 DIAGNOSIS — C61 Malignant neoplasm of prostate: Secondary | ICD-10-CM

## 2011-11-18 ENCOUNTER — Encounter (HOSPITAL_COMMUNITY)
Admission: RE | Admit: 2011-11-18 | Discharge: 2011-11-18 | Disposition: A | Discharge status: Home / Self Care | Payer: Managed Care, Other (non HMO) | Source: Ambulatory Visit | Attending: Urology | Admitting: Urology

## 2011-11-18 ENCOUNTER — Encounter (HOSPITAL_COMMUNITY): Payer: Self-pay

## 2011-11-18 ENCOUNTER — Ambulatory Visit (HOSPITAL_COMMUNITY)
Admission: RE | Admit: 2011-11-18 | Discharge: 2011-11-18 | Disposition: A | Discharge status: Home / Self Care | Payer: Managed Care, Other (non HMO) | Source: Ambulatory Visit | Attending: Urology | Admitting: Urology

## 2011-11-18 DIAGNOSIS — C61 Malignant neoplasm of prostate: Secondary | ICD-10-CM

## 2011-11-18 MED ORDER — TECHNETIUM TC 99M MEDRONATE IV KIT
24.0000 | PACK | Freq: Once | INTRAVENOUS | Status: AC | PRN
  Administered 2011-11-18: 24 via INTRAVENOUS

## 2011-11-22 ENCOUNTER — Other Ambulatory Visit: Payer: Self-pay | Admitting: Urology

## 2011-11-22 DIAGNOSIS — R102 Pelvic and perineal pain: Secondary | ICD-10-CM

## 2011-11-22 DIAGNOSIS — C61 Malignant neoplasm of prostate: Secondary | ICD-10-CM

## 2011-11-30 ENCOUNTER — Ambulatory Visit
Admission: RE | Admit: 2011-11-30 | Discharge: 2011-11-30 | Disposition: A | Discharge status: Home / Self Care | Payer: Managed Care, Other (non HMO) | Source: Ambulatory Visit | Attending: Urology | Admitting: Urology

## 2011-11-30 DIAGNOSIS — C61 Malignant neoplasm of prostate: Secondary | ICD-10-CM

## 2011-11-30 DIAGNOSIS — R102 Pelvic and perineal pain: Secondary | ICD-10-CM

## 2011-11-30 MED ORDER — GADOBENATE DIMEGLUMINE 529 MG/ML IV SOLN
20.0000 mL | Freq: Once | INTRAVENOUS | Status: AC | PRN
  Administered 2011-11-30: 20 mL via INTRAVENOUS

## 2012-11-19 ENCOUNTER — Encounter (HOSPITAL_BASED_OUTPATIENT_CLINIC_OR_DEPARTMENT_OTHER): Payer: Self-pay | Admitting: *Deleted

## 2012-11-19 ENCOUNTER — Emergency Department (HOSPITAL_BASED_OUTPATIENT_CLINIC_OR_DEPARTMENT_OTHER)
Admission: EM | Admit: 2012-11-19 | Discharge: 2012-11-19 | Disposition: A | Discharge status: Home / Self Care | Payer: Managed Care, Other (non HMO) | Attending: Emergency Medicine | Admitting: Emergency Medicine

## 2012-11-19 DIAGNOSIS — Z87440 Personal history of urinary (tract) infections: Secondary | ICD-10-CM | POA: Insufficient documentation

## 2012-11-19 DIAGNOSIS — R630 Anorexia: Secondary | ICD-10-CM | POA: Insufficient documentation

## 2012-11-19 DIAGNOSIS — N390 Urinary tract infection, site not specified: Secondary | ICD-10-CM

## 2012-11-19 DIAGNOSIS — IMO0001 Reserved for inherently not codable concepts without codable children: Secondary | ICD-10-CM | POA: Insufficient documentation

## 2012-11-19 DIAGNOSIS — R509 Fever, unspecified: Secondary | ICD-10-CM | POA: Insufficient documentation

## 2012-11-19 DIAGNOSIS — Z791 Long term (current) use of non-steroidal anti-inflammatories (NSAID): Secondary | ICD-10-CM | POA: Insufficient documentation

## 2012-11-19 DIAGNOSIS — Z8546 Personal history of malignant neoplasm of prostate: Secondary | ICD-10-CM | POA: Insufficient documentation

## 2012-11-19 DIAGNOSIS — Z79899 Other long term (current) drug therapy: Secondary | ICD-10-CM | POA: Insufficient documentation

## 2012-11-19 LAB — COMPREHENSIVE METABOLIC PANEL
AST: 29 U/L (ref 0–37)
Albumin: 3.7 g/dL (ref 3.5–5.2)
Alkaline Phosphatase: 83 U/L (ref 39–117)
BUN: 21 mg/dL (ref 6–23)
Chloride: 98 mEq/L (ref 96–112)
Potassium: 4.4 mEq/L (ref 3.5–5.1)
Sodium: 137 mEq/L (ref 135–145)
Total Protein: 8.1 g/dL (ref 6.0–8.3)

## 2012-11-19 LAB — CBC WITH DIFFERENTIAL/PLATELET
Basophils Absolute: 0 10*3/uL (ref 0.0–0.1)
Basophils Relative: 0 % (ref 0–1)
Lymphocytes Relative: 11 % — ABNORMAL LOW (ref 12–46)
MCHC: 33.5 g/dL (ref 30.0–36.0)
Neutro Abs: 6 10*3/uL (ref 1.7–7.7)
Neutrophils Relative %: 73 % (ref 43–77)
RDW: 14.4 % (ref 11.5–15.5)
WBC: 8.2 10*3/uL (ref 4.0–10.5)

## 2012-11-19 LAB — URINALYSIS, ROUTINE W REFLEX MICROSCOPIC
Nitrite: POSITIVE — AB
Specific Gravity, Urine: 1.019 (ref 1.005–1.030)
Urobilinogen, UA: 1 mg/dL (ref 0.0–1.0)
pH: 5.5 (ref 5.0–8.0)

## 2012-11-19 LAB — URINE MICROSCOPIC-ADD ON

## 2012-11-19 MED ORDER — HYDROCODONE-ACETAMINOPHEN 5-325 MG PO TABS
1.0000 | ORAL_TABLET | Freq: Once | ORAL | Status: AC
  Administered 2012-11-19: 1 via ORAL
  Filled 2012-11-19: qty 1

## 2012-11-19 MED ORDER — LEVOFLOXACIN 750 MG PO TABS: 750.0000 mg | ORAL_TABLET | Freq: Every day | ORAL | Status: DC

## 2012-11-19 MED ORDER — PROMETHAZINE HCL 25 MG PO TABS: 25.0000 mg | ORAL_TABLET | Freq: Four times a day (QID) | ORAL | Status: DC | PRN

## 2012-11-19 MED ORDER — HYDROCODONE-ACETAMINOPHEN 5-325 MG PO TABS: 1.0000 | ORAL_TABLET | ORAL | Status: DC | PRN

## 2012-11-19 NOTE — ED Notes (Signed)
Pt has had fever, chills, body aches since Thursday. Decreased appetite. Pt has suprapubic cath and is prone to UTI's. Feels like that is what this is.

## 2012-11-19 NOTE — ED Provider Notes (Signed)
History     CSN: [id number]  Arrival date & time 11/19/12  1407   First MD Initiated Contact with Patient 11/19/12 1453      Chief Complaint  Patient presents with  . Fever    (Consider location/radiation/quality/duration/timing/severity/associated sxs/prior treatment) HPI Comments: Pt presents to the ED for fever, chills, body aches, and decreased appetite x 4 days.  Pt has a suprapubic catheter in place, changed last week by Dr. Margarita Grizzle. Wife is a nurse who keeps close eyes on his urine output, has noticed some blood in the urine recently without evidence of fecal matter.  Hx of prostate cancer tx with radiation, brachy therapy, then attempted freezing.  Prostate burst and transformed the anatomy of his bladder and ureters.  Pt has been taking increased amounts of ASA for the body aches.  Denise any chest pain, SOB, abdominal pain, low back pain.    Patient is a 67 y.o. male presenting with fever. The history is provided by the patient.  Fever Associated symptoms: chills and myalgias     Past Medical History  Diagnosis Date  . Nocturia   . Recurrent UTI   . Prostate cancer S/P RADIOACTIVE SEED IMPLANTS 2008 AND CRYOABLATION OF PROSTATE [id number]  . Bladder retention of urine     foley since  09/27/11    Past Surgical History  Procedure Laterality Date  . Radioactive seed implant  2008    PROSTATE  . Reduction right hand fx  2008  . Wisdom tooth extraction  IN [place]  . Prostate cryoablation  02-19-2011  . Cystoscopy  09/15/2011    Procedure: CYSTOSCOPY;  Surgeon: Marcine Matar, MD;  Location: [facility];  Service: Urology;;  cystogram  . Cystoscopy  10/11/2011    Procedure: CYSTOSCOPY;  Surgeon: Marcine Matar, MD;  Location: [facility];  Service: Urology;  Laterality: N/A;    History reviewed. No pertinent family history.  History  Substance Use Topics  . Smoking status: Never Smoker   . Smokeless tobacco: Never Used  .  Alcohol Use: Yes     Comment: OCCASIONAL      Review of Systems  Constitutional: Positive for fever, chills and appetite change.  Musculoskeletal: Positive for myalgias.  All other systems reviewed and are negative.    Allergies  Review of patient's allergies indicates no known allergies.  Home Medications   Current Outpatient Rx  Name  Route  Sig  Dispense  Refill  . HYDROcodone-acetaminophen (NORCO) 5-325 MG per tablet   Oral   Take 1 tablet by mouth every 6 (six) hours as needed.         Marland Kitchen ibuprofen (ADVIL,MOTRIN) 200 MG tablet   Oral   Take 200 mg by mouth every 6 (six) hours as needed.         . naproxen sodium (ANAPROX) 220 MG tablet   Oral   Take 220 mg by mouth 2 (two) times daily with a meal.         . oxyCODONE-acetaminophen (PERCOCET) 5-325 MG per tablet   Oral   Take 1 tablet by mouth every 4 (four) hours as needed.         . solifenacin (VESICARE) 5 MG tablet   Oral   Take 10 mg by mouth 2 (two) times daily.           BP 146/88  Pulse 102  Temp(Src) 98.2 F (36.8 C) (Oral)  Resp 20  Ht 6\' 3"  (1.905 m)  Wt  218 lb (98.884 kg)  BMI 27.25 kg/m2  SpO2 96%  Physical Exam  Nursing note and vitals reviewed. Constitutional: He is oriented to person, place, and time. He appears well-developed and well-nourished.  HENT:  Head: Normocephalic and atraumatic.  Mouth/Throat: Oropharynx is clear and moist.  Eyes: Conjunctivae and EOM are normal. Pupils are equal, round, and reactive to light.  Neck: Normal range of motion. Neck supple.  Cardiovascular: Normal rate, regular rhythm and normal heart sounds.   Pulmonary/Chest: Effort normal and breath sounds normal.  Abdominal: Soft. Bowel sounds are normal. There is no tenderness. There is no CVA tenderness, no tenderness at McBurney's point and negative Murphy's sign.  Suprapubic catheter in place, ostomy site is clean and without erythema, swelling, or signs of infection  Musculoskeletal: Normal  range of motion. He exhibits no edema.  Lymphadenopathy:    He has no cervical adenopathy.  Neurological: He is alert and oriented to person, place, and time.  Skin: Skin is warm and dry.  Psychiatric: He has a normal mood and affect.    ED Course  Procedures (including critical care time)  Labs Reviewed  CBC WITH DIFFERENTIAL - Abnormal; Notable for the following:    Lymphocytes Relative 11 (*)    Monocytes Relative 15 (*)    Monocytes Absolute 1.2 (*)    All other components within normal limits  URINALYSIS, ROUTINE W REFLEX MICROSCOPIC - Abnormal; Notable for the following:    APPearance CLOUDY (*)    Hgb urine dipstick LARGE (*)    Ketones, ur 15 (*)    Protein, ur 100 (*)    Nitrite POSITIVE (*)    Leukocytes, UA LARGE (*)    All other components within normal limits  COMPREHENSIVE METABOLIC PANEL - Abnormal; Notable for the following:    Glucose, Bld 106 (*)    GFR calc non Af Amer 69 (*)    GFR calc Af Amer 80 (*)    All other components within normal limits  URINE MICROSCOPIC-ADD ON - Abnormal; Notable for the following:    Bacteria, UA MANY (*)    All other components within normal limits  URINE CULTURE   No results found.   1. Urinary tract infection   2. Fever       MDM   U/A positive for UTI as expected.  Urine culture pending.  No leukocytosis- low suspicion for sepsis.  VS stable and sx improved while in the ED.  Pt states ciprofloxacin does not usually work, will give levaquin 7d as bacteria is likely to be atypical.  Rx given vicodin and phenergan PRN sx.  Follow up asap with urologist.  Strict return precautions advised.        Garlon Hatchet, PA-C 11/19/12 1625

## 2012-11-19 NOTE — ED Notes (Signed)
Changed leg bag with new bag for urine specimen.

## 2012-11-20 NOTE — ED Provider Notes (Signed)
Medical screening examination/treatment/procedure(s) were performed by non-physician practitioner and as supervising physician I was immediately available for consultation/collaboration.   Nelia Shi, MD 11/20/12 [phone]

## 2012-11-21 LAB — URINE CULTURE: Colony Count: >=100000

## 2012-11-22 ENCOUNTER — Telehealth (HOSPITAL_COMMUNITY): Payer: Self-pay | Admitting: Emergency Medicine

## 2012-11-22 NOTE — ED Notes (Signed)
Results received from [facility]. (+) [facility]  Rx given in ED for Levofloxacin -> sensitive to the same.  Chart appended per protocol.

## 2012-12-14 ENCOUNTER — Other Ambulatory Visit: Payer: Self-pay | Admitting: Urology

## 2012-12-14 DIAGNOSIS — C61 Malignant neoplasm of prostate: Secondary | ICD-10-CM

## 2012-12-28 ENCOUNTER — Encounter (HOSPITAL_COMMUNITY): Payer: Managed Care, Other (non HMO)

## 2012-12-28 ENCOUNTER — Ambulatory Visit (HOSPITAL_COMMUNITY): Payer: Managed Care, Other (non HMO)

## 2012-12-29 ENCOUNTER — Encounter (HOSPITAL_COMMUNITY)
Admission: RE | Admit: 2012-12-29 | Discharge: 2012-12-29 | Disposition: A | Discharge status: Home / Self Care | Payer: Managed Care, Other (non HMO) | Source: Ambulatory Visit | Attending: Urology | Admitting: Urology

## 2012-12-29 ENCOUNTER — Encounter (HOSPITAL_COMMUNITY): Payer: Self-pay

## 2012-12-29 DIAGNOSIS — C61 Malignant neoplasm of prostate: Secondary | ICD-10-CM

## 2012-12-29 DIAGNOSIS — M25559 Pain in unspecified hip: Secondary | ICD-10-CM | POA: Insufficient documentation

## 2012-12-29 DIAGNOSIS — C7951 Secondary malignant neoplasm of bone: Secondary | ICD-10-CM | POA: Insufficient documentation

## 2012-12-29 DIAGNOSIS — C7952 Secondary malignant neoplasm of bone marrow: Secondary | ICD-10-CM | POA: Insufficient documentation

## 2012-12-29 MED ORDER — TECHNETIUM TC 99M MEDRONATE IV KIT
25.0000 | PACK | Freq: Once | INTRAVENOUS | Status: AC | PRN
  Administered 2012-12-29: 25 via INTRAVENOUS

## 2013-07-30 HISTORY — PX: BLADDER SURGERY: SHX569

## 2013-07-31 ENCOUNTER — Other Ambulatory Visit: Payer: Self-pay | Admitting: Orthopedic Surgery

## 2013-08-02 HISTORY — PX: CYSTECTOMY: SUR359

## 2013-08-02 HISTORY — PX: ROBOT ASSISTED LAPAROSCOPIC COMPLETE CYSTECT ILEAL CONDUIT: SHX5139

## 2013-08-15 ENCOUNTER — Inpatient Hospital Stay: Admit: 2013-08-15 | Payer: Managed Care, Other (non HMO) | Admitting: Orthopedic Surgery

## 2013-08-15 SURGERY — ARTHROPLASTY, HIP, TOTAL, ANTERIOR APPROACH
Anesthesia: Choice | Site: Hip | Laterality: Right

## 2013-08-22 ENCOUNTER — Encounter (HOSPITAL_BASED_OUTPATIENT_CLINIC_OR_DEPARTMENT_OTHER): Payer: Managed Care, Other (non HMO) | Attending: General Surgery

## 2013-08-22 DIAGNOSIS — M869 Osteomyelitis, unspecified: Secondary | ICD-10-CM | POA: Insufficient documentation

## 2013-08-22 DIAGNOSIS — Z923 Personal history of irradiation: Secondary | ICD-10-CM | POA: Insufficient documentation

## 2013-08-22 DIAGNOSIS — Z906 Acquired absence of other parts of urinary tract: Secondary | ICD-10-CM | POA: Insufficient documentation

## 2013-08-22 DIAGNOSIS — Z9079 Acquired absence of other genital organ(s): Secondary | ICD-10-CM | POA: Insufficient documentation

## 2013-08-22 DIAGNOSIS — C61 Malignant neoplasm of prostate: Secondary | ICD-10-CM | POA: Insufficient documentation

## 2013-08-22 NOTE — Progress Notes (Signed)
[facility]  NAME:  [name], [name]                  ACCOUNT NO.:  [id number]  MEDICAL RECORD NO.:  [id number]      DATE OF BIRTH:  1946-01-14  PHYSICIAN:  Ardath Sax, M.D.           VISIT DATE:                                  OFFICE VISIT   Kevin Stafford is a 67 year old gentleman who works as a Technical brewer of a Nurse, learning disability.  He is 67 years of age.  He comes to [place] because of a long history of prostate cancer.  Today, his blood pressure is 130/86, respirations 17, pulse 90, temperature 98.  He weighs 214 pounds and he is 6 feet 3 inches.  Starting in 2008, he has had a long history of carcinoma of the prostate and has had many operations and treatments and actually already he has had this treated by radiation.  He has also had treated by cryosurgery and he eventually had a total prostatectomy after he had radiation therapy.  With all of this treatment, he has been left with a ileal loop for he has had his bladder totally removed along with his prostate and he has both of his ureters plugged into an ileal loop.  Because of all the radiation, he has been left with biopsy-proven pubic bone osteomyelitis.  He is on long-term Rocephin antibiotic given intravenously.  His doctors feel and I certainly agree that hyperbaric oxygen treatments would be of great help in trying to clear up his osteomyelitis along with the IV antibiotics.  So, we are applying to his insurance company to help with his treatments as we are planning on 5-6 weeks of treatments for 2 hours a day, Monday through Friday.  If his insurance company would help [place] with the financial responsibility that would be gratefully appreciated.     Ardath Sax, M.D.     PP/MEDQ  D:  08/22/2013  T:  08/22/2013  Job:  [id number]

## 2013-08-28 ENCOUNTER — Other Ambulatory Visit (HOSPITAL_COMMUNITY): Payer: Self-pay | Admitting: General Surgery

## 2013-08-28 ENCOUNTER — Ambulatory Visit (HOSPITAL_COMMUNITY)
Admission: RE | Admit: 2013-08-28 | Discharge: 2013-08-28 | Disposition: A | Discharge status: Home / Self Care | Payer: Managed Care, Other (non HMO) | Source: Ambulatory Visit | Attending: General Surgery | Admitting: General Surgery

## 2013-08-28 DIAGNOSIS — Z01818 Encounter for other preprocedural examination: Secondary | ICD-10-CM | POA: Insufficient documentation

## 2013-08-28 DIAGNOSIS — M869 Osteomyelitis, unspecified: Secondary | ICD-10-CM | POA: Insufficient documentation

## 2013-08-28 DIAGNOSIS — T148XXA Other injury of unspecified body region, initial encounter: Secondary | ICD-10-CM

## 2013-09-07 ENCOUNTER — Encounter (HOSPITAL_BASED_OUTPATIENT_CLINIC_OR_DEPARTMENT_OTHER): Payer: Medicare Other | Attending: General Surgery

## 2013-09-07 DIAGNOSIS — M86659 Other chronic osteomyelitis, unspecified thigh: Secondary | ICD-10-CM | POA: Insufficient documentation

## 2013-09-07 DIAGNOSIS — Y842 Radiological procedure and radiotherapy as the cause of abnormal reaction of the patient, or of later complication, without mention of misadventure at the time of the procedure: Secondary | ICD-10-CM | POA: Insufficient documentation

## 2013-09-07 DIAGNOSIS — T66XXXA Radiation sickness, unspecified, initial encounter: Secondary | ICD-10-CM | POA: Insufficient documentation

## 2013-10-01 ENCOUNTER — Encounter (HOSPITAL_BASED_OUTPATIENT_CLINIC_OR_DEPARTMENT_OTHER): Payer: Medicare Other | Attending: General Surgery

## 2013-10-01 DIAGNOSIS — M869 Osteomyelitis, unspecified: Secondary | ICD-10-CM | POA: Insufficient documentation

## 2013-10-01 DIAGNOSIS — Y842 Radiological procedure and radiotherapy as the cause of abnormal reaction of the patient, or of later complication, without mention of misadventure at the time of the procedure: Secondary | ICD-10-CM | POA: Insufficient documentation

## 2013-10-29 ENCOUNTER — Encounter (HOSPITAL_BASED_OUTPATIENT_CLINIC_OR_DEPARTMENT_OTHER): Payer: Medicare Other | Attending: General Surgery

## 2013-10-29 DIAGNOSIS — Y842 Radiological procedure and radiotherapy as the cause of abnormal reaction of the patient, or of later complication, without mention of misadventure at the time of the procedure: Secondary | ICD-10-CM | POA: Insufficient documentation

## 2013-10-29 DIAGNOSIS — M869 Osteomyelitis, unspecified: Secondary | ICD-10-CM | POA: Insufficient documentation

## 2013-10-29 DIAGNOSIS — T66XXXS Radiation sickness, unspecified, sequela: Secondary | ICD-10-CM | POA: Insufficient documentation

## 2013-12-18 ENCOUNTER — Other Ambulatory Visit: Payer: Self-pay | Admitting: Urology

## 2013-12-18 DIAGNOSIS — C61 Malignant neoplasm of prostate: Secondary | ICD-10-CM

## 2013-12-24 ENCOUNTER — Encounter (HOSPITAL_COMMUNITY)
Admission: RE | Admit: 2013-12-24 | Discharge: 2013-12-24 | Disposition: A | Discharge status: Home / Self Care | Payer: Medicare Other | Source: Ambulatory Visit | Attending: Urology | Admitting: Urology

## 2013-12-24 DIAGNOSIS — C61 Malignant neoplasm of prostate: Secondary | ICD-10-CM | POA: Insufficient documentation

## 2013-12-24 MED ORDER — TECHNETIUM TC 99M MEDRONATE IV KIT
24.9000 | PACK | Freq: Once | INTRAVENOUS | Status: AC | PRN
  Administered 2013-12-24: 24.9 via INTRAVENOUS

## 2013-12-25 ENCOUNTER — Ambulatory Visit (HOSPITAL_COMMUNITY): Payer: Medicare Other

## 2013-12-25 ENCOUNTER — Encounter (HOSPITAL_COMMUNITY): Payer: Medicare Other

## 2014-04-04 ENCOUNTER — Other Ambulatory Visit: Payer: Self-pay | Admitting: Orthopedic Surgery

## 2014-04-04 NOTE — Progress Notes (Signed)
Preoperative surgical orders have been place into the Epic hospital system for Kevin Stafford on 04/04/2014, 10:00 AM  by Mickel Crow for surgery on 05/08/2014.  Preop Total Hip orders including Experel Injecion, IV Tylenol, and IV Decadron as long as there are no contraindications to the above medications. Arlee Muslim, PA-C

## 2014-04-24 ENCOUNTER — Encounter (HOSPITAL_COMMUNITY): Payer: Self-pay | Admitting: Pharmacy Technician

## 2014-04-29 ENCOUNTER — Encounter (HOSPITAL_COMMUNITY)
Admission: RE | Admit: 2014-04-29 | Discharge: 2014-04-29 | Disposition: A | Discharge status: Home / Self Care | Payer: Medicare Other | Source: Ambulatory Visit | Attending: Orthopedic Surgery | Admitting: Orthopedic Surgery

## 2014-04-29 ENCOUNTER — Encounter (INDEPENDENT_AMBULATORY_CARE_PROVIDER_SITE_OTHER): Payer: Self-pay

## 2014-04-29 ENCOUNTER — Ambulatory Visit (HOSPITAL_COMMUNITY)
Admission: RE | Admit: 2014-04-29 | Discharge: 2014-04-29 | Disposition: A | Discharge status: Home / Self Care | Payer: Medicare Other | Source: Ambulatory Visit | Attending: Orthopedic Surgery | Admitting: Orthopedic Surgery

## 2014-04-29 ENCOUNTER — Encounter (HOSPITAL_COMMUNITY): Payer: Self-pay

## 2014-04-29 DIAGNOSIS — Z01818 Encounter for other preprocedural examination: Secondary | ICD-10-CM | POA: Insufficient documentation

## 2014-04-29 HISTORY — DX: Essential (primary) hypertension: I10

## 2014-04-29 HISTORY — DX: Radiation sickness, unspecified, initial encounter: T66.XXXA

## 2014-04-29 HISTORY — DX: Unspecified osteoarthritis, unspecified site: M19.90

## 2014-04-29 HISTORY — DX: Personal history of urinary calculi: Z87.442

## 2014-04-29 HISTORY — DX: Osteomyelitis, unspecified: M86.9

## 2014-04-29 LAB — COMPREHENSIVE METABOLIC PANEL
ALK PHOS: 88 U/L (ref 39–117)
ALT: 14 U/L (ref 0–53)
AST: 15 U/L (ref 0–37)
Albumin: 3.9 g/dL (ref 3.5–5.2)
Anion gap: 17 — ABNORMAL HIGH (ref 5–15)
BUN: 29 mg/dL — ABNORMAL HIGH (ref 6–23)
CO2: 23 mEq/L (ref 19–32)
Calcium: 9.8 mg/dL (ref 8.4–10.5)
Chloride: 105 mEq/L (ref 96–112)
Creatinine, Ser: 1.41 mg/dL — ABNORMAL HIGH (ref 0.50–1.35)
GFR calc Af Amer: 58 mL/min — ABNORMAL LOW (ref >90)
GFR calc non Af Amer: 50 mL/min — ABNORMAL LOW (ref >90)
Glucose, Bld: 110 mg/dL — ABNORMAL HIGH (ref 70–99)
POTASSIUM: 4.6 meq/L (ref 3.7–5.3)
SODIUM: 145 meq/L (ref 137–147)
Total Bilirubin: <0.2 mg/dL — ABNORMAL LOW (ref 0.3–1.2)
Total Protein: 7.8 g/dL (ref 6.0–8.3)

## 2014-04-29 LAB — URINALYSIS, ROUTINE W REFLEX MICROSCOPIC
Bilirubin Urine: NEGATIVE
GLUCOSE, UA: NEGATIVE mg/dL
Hgb urine dipstick: NEGATIVE
KETONES UR: NEGATIVE mg/dL
Nitrite: POSITIVE — AB
PH: 6 (ref 5.0–8.0)
Protein, ur: NEGATIVE mg/dL
SPECIFIC GRAVITY, URINE: 1.017 (ref 1.005–1.030)
Urobilinogen, UA: 0.2 mg/dL (ref 0.0–1.0)

## 2014-04-29 LAB — SURGICAL PCR SCREEN
MRSA, PCR: NEGATIVE
STAPHYLOCOCCUS AUREUS: NEGATIVE

## 2014-04-29 LAB — URINE MICROSCOPIC-ADD ON

## 2014-04-29 LAB — CBC
HCT: 40.9 % (ref 39.0–52.0)
HEMOGLOBIN: 12.9 g/dL — AB (ref 13.0–17.0)
MCH: 26.7 pg (ref 26.0–34.0)
MCHC: 31.5 g/dL (ref 30.0–36.0)
MCV: 84.5 fL (ref 78.0–100.0)
PLATELETS: 262 10*3/uL (ref 150–400)
RBC: 4.84 MIL/uL (ref 4.22–5.81)
RDW: 15.6 % — ABNORMAL HIGH (ref 11.5–15.5)
WBC: 8.2 10*3/uL (ref 4.0–10.5)

## 2014-04-29 LAB — APTT: APTT: 27 s (ref 24–37)

## 2014-04-29 LAB — PROTIME-INR
INR: 1.05 (ref 0.00–1.49)
Prothrombin Time: 13.7 seconds (ref 11.6–15.2)

## 2014-04-29 NOTE — Patient Instructions (Addendum)
[address] [place]  04/29/2014   Your procedure is scheduled on: Wednesday 05/08/14  Report to [facility] at 06:30 AM.  Call this number if you have problems the morning of surgery 336-: [phone]   Remember:   Do not eat food or drink liquids After Midnight.   Do not wear jewelry, make-up or nail polish.  Do not wear lotions, powders, or perfumes. You may wear deodorant.  Do not shave 48 hours prior to surgery. Men may shave face and neck.  Do not bring valuables to the hospital.  Contacts, dentures or bridgework may not be worn into surgery.  Leave suitcase in the car. After surgery it may be brought to your room.  For patients admitted to the hospital, checkout time is 11:00 AM the day of discharge.   Please read over the following fact sheets that you were given: MRSA Information Paulette Blanch, RN  pre op nurse call if needed [phone]    [facility] - Preparing for Surgery Before surgery, you can play an important role.  Because skin is not sterile, your skin needs to be as free of germs as possible.  You can reduce the number of germs on your skin by washing with CHG (chlorahexidine gluconate) soap before surgery.  CHG is an antiseptic cleaner which kills germs and bonds with the skin to continue killing germs even after washing. Please DO NOT use if you have an allergy to CHG or antibacterial soaps.  If your skin becomes reddened/irritated stop using the CHG and inform your nurse when you arrive at Short Stay. Do not shave (including legs and underarms) for at least 48 hours prior to the first CHG shower.  You may shave your face/neck. Please follow these instructions carefully:  1.  Shower with CHG Soap the night before surgery and the  morning of Surgery.  2.  If you choose to wash your hair, wash your hair first as usual with your  normal  shampoo.  3.  After you shampoo, rinse your hair and body thoroughly to remove the  shampoo.                            4.   Use CHG as you would any other liquid soap.  You can apply chg directly  to the skin and wash                       Gently with a scrungie or clean washcloth.  5.  Apply the CHG Soap to your body ONLY FROM THE NECK DOWN.   Do not use on face/ open                           Wound or open sores. Avoid contact with eyes, ears mouth and genitals (private parts).                       Wash face,  Genitals (private parts) with your normal soap.             6.  Wash thoroughly, paying special attention to the area where your surgery  will be performed.  7.  Thoroughly rinse your body with warm water from the neck down.  8.  DO NOT shower/wash with your normal soap after using and rinsing off  the CHG Soap.  9.  Pat yourself dry with a clean towel.            10.  Wear clean pajamas.            11.  Place clean sheets on your bed the night of your first shower and do not  sleep with pets. Day of Surgery : Do not apply any lotions/deodorants the morning of surgery.  Please wear clean clothes to the hospital/surgery center.  FAILURE TO FOLLOW THESE INSTRUCTIONS MAY RESULT IN THE CANCELLATION OF YOUR SURGERY PATIENT SIGNATURE_________________________________  NURSE SIGNATURE__________________________________  ________________________________________________________________________  WHAT IS A BLOOD TRANSFUSION? Blood Transfusion Information  A transfusion is the replacement of blood or some of its parts. Blood is made up of multiple cells which provide different functions.  Red blood cells carry oxygen and are used for blood loss replacement.  White blood cells fight against infection.  Platelets control bleeding.  Plasma helps clot blood.  Other blood products are available for specialized needs, such as hemophilia or other clotting disorders. BEFORE THE TRANSFUSION  Who gives blood for transfusions?   Healthy volunteers who are fully evaluated to make sure their blood is  safe. This is blood bank blood. Transfusion therapy is the safest it has ever been in the practice of medicine. Before blood is taken from a donor, a complete history is taken to make sure that person has no history of diseases nor engages in risky social behavior (examples are intravenous drug use or sexual activity with multiple partners). The donor's travel history is screened to minimize risk of transmitting infections, such as malaria. The donated blood is tested for signs of infectious diseases, such as HIV and hepatitis. The blood is then tested to be sure it is compatible with you in order to minimize the chance of a transfusion reaction. If you or a relative donates blood, this is often done in anticipation of surgery and is not appropriate for emergency situations. It takes many days to process the donated blood. RISKS AND COMPLICATIONS Although transfusion therapy is very safe and saves many lives, the main dangers of transfusion include:   Getting an infectious disease.  Developing a transfusion reaction. This is an allergic reaction to something in the blood you were given. Every precaution is taken to prevent this. The decision to have a blood transfusion has been considered carefully by your caregiver before blood is given. Blood is not given unless the benefits outweigh the risks. AFTER THE TRANSFUSION  Right after receiving a blood transfusion, you will usually feel much better and more energetic. This is especially true if your red blood cells have gotten low (anemic). The transfusion raises the level of the red blood cells which carry oxygen, and this usually causes an energy increase.  The nurse administering the transfusion will monitor you carefully for complications. HOME CARE INSTRUCTIONS  No special instructions are needed after a transfusion. You may find your energy is better. Speak with your caregiver about any limitations on activity for underlying diseases you may  have. SEEK MEDICAL CARE IF:   Your condition is not improving after your transfusion.  You develop redness or irritation at the intravenous (IV) site. SEEK IMMEDIATE MEDICAL CARE IF:  Any of the following symptoms occur over the next 12 hours:  Shaking chills.  You have a temperature by mouth above 102 F (38.9 C), not controlled by medicine.  Chest, back, or muscle pain.  People around you feel you are not acting correctly or  are confused.  Shortness of breath or difficulty breathing.  Dizziness and fainting.  You get a rash or develop hives.  You have a decrease in urine output.  Your urine turns a dark color or changes to pink, red, or brown. Any of the following symptoms occur over the next 10 days:  You have a temperature by mouth above 102 F (38.9 C), not controlled by medicine.  Shortness of breath.  Weakness after normal activity.  The white part of the eye turns yellow (jaundice).  You have a decrease in the amount of urine or are urinating less often.  Your urine turns a dark color or changes to pink, red, or brown. Document Released: 08/13/2000 Document Revised: 11/08/2011 Document Reviewed: 04/01/2008 ExitCare Patient Information 2014 [place].  _______________________________________________________________________  Incentive Spirometer  An incentive spirometer is a tool that can help keep your lungs clear and active. This tool measures how well you are filling your lungs with each breath. Taking long deep breaths may help reverse or decrease the chance of developing breathing (pulmonary) problems (especially infection) following:  A long period of time when you are unable to move or be active. BEFORE THE PROCEDURE   If the spirometer includes an indicator to show your best effort, your nurse or respiratory therapist will set it to a desired goal.  If possible, sit up straight or lean slightly forward. Try not to slouch.  Hold the  incentive spirometer in an upright position. INSTRUCTIONS FOR USE  1. Sit on the edge of your bed if possible, or sit up as far as you can in bed or on a chair. 2. Hold the incentive spirometer in an upright position. 3. Breathe out normally. 4. Place the mouthpiece in your mouth and seal your lips tightly around it. 5. Breathe in slowly and as deeply as possible, raising the piston or the ball toward the top of the column. 6. Hold your breath for 3-5 seconds or for as long as possible. Allow the piston or ball to fall to the bottom of the column. 7. Remove the mouthpiece from your mouth and breathe out normally. 8. Rest for a few seconds and repeat Steps 1 through 7 at least 10 times every 1-2 hours when you are awake. Take your time and take a few normal breaths between deep breaths. 9. The spirometer may include an indicator to show your best effort. Use the indicator as a goal to work toward during each repetition. 10. After each set of 10 deep breaths, practice coughing to be sure your lungs are clear. If you have an incision (the cut made at the time of surgery), support your incision when coughing by placing a pillow or rolled up towels firmly against it. Once you are able to get out of bed, walk around indoors and cough well. You may stop using the incentive spirometer when instructed by your caregiver.  RISKS AND COMPLICATIONS  Take your time so you do not get dizzy or light-headed.  If you are in pain, you may need to take or ask for pain medication before doing incentive spirometry. It is harder to take a deep breath if you are having pain. AFTER USE  Rest and breathe slowly and easily.  It can be helpful to keep track of a log of your progress. Your caregiver can provide you with a simple table to help with this. If you are using the spirometer at home, follow these instructions: [place] IF:  You are having difficultly using the spirometer.  You have trouble using  the spirometer as often as instructed.  Your pain medication is not giving enough relief while using the spirometer.  You develop fever of 100.5 F (38.1 C) or higher. SEEK IMMEDIATE MEDICAL CARE IF:   You cough up bloody sputum that had not been present before.  You develop fever of 102 F (38.9 C) or greater.  You develop worsening pain at or near the incision site. MAKE SURE YOU:   Understand these instructions.  Will watch your condition.  Will get help right away if you are not doing well or get worse. Document Released: 12/27/2006 Document Revised: 11/08/2011 Document Reviewed: 02/27/2007 [facility] Patient Information 2014 [place], [place].   ________________________________________________________________________

## 2014-04-29 NOTE — Progress Notes (Addendum)
Surgery clearance note Dr. Redmond Pulling 05/21/13 on chart, chest x-ray 08/28/13 on EPIC

## 2014-05-02 ENCOUNTER — Other Ambulatory Visit: Payer: Self-pay | Admitting: Surgical

## 2014-05-02 NOTE — H&P (Signed)
TOTAL HIP ADMISSION H&P  Patient is admitted for right total hip arthroplasty.  Subjective:  Chief Complaint: right hip pain  HPI: Kevin Stafford, 68 y.o. male, has a history of pain and functional disability in the right hip(s) due to arthritis and patient has failed non-surgical conservative treatments for greater than 12 weeks to include NSAID's and/or analgesics, corticosteriod injections and activity modification.  Onset of symptoms was gradual starting >10 years ago with gradually worsening course since that time.The patient noted no past surgery on the right hip(s).  Patient currently rates pain in the right hip at 7 out of 10 with activity. Patient has night pain, worsening of pain with activity and weight bearing, pain that interfers with activities of daily living and pain with passive range of motion. Patient has evidence of periarticular osteophytes, joint space narrowing and erosion of femoral head by imaging studies. This condition presents safety issues increasing the risk of falls.  There is no current active infection.   Past Medical History  Diagnosis Date  . Nocturia   . Recurrent UTI   . Prostate cancer S/P RADIOACTIVE SEED IMPLANTS 2008 AND CRYOABLATION OF PROSTATE [id number]  . Bladder retention of urine     foley since  09/27/11  . Osteomyelitis of pelvis   . Toxicity, radiation     "radiation in 2008-too much, bladder shrunk and prostate exploded"2012 s/s started so went to [facility]  . Hypertension   . History of kidney stones   . Arthritis     Past Surgical History  Procedure Laterality Date  . Radioactive seed implant  2008    PROSTATE  . Reduction right hand fx  2008  . Wisdom tooth extraction  [place]  . Prostate cryoablation  02-19-2011  . Cystoscopy  09/15/2011    Procedure: CYSTOSCOPY;  Surgeon: Franchot Gallo, MD;  Location: [facility];  Service: Urology;;  cystogram  . Cystoscopy  10/11/2011    Procedure: CYSTOSCOPY;  Surgeon: Franchot Gallo, MD;  Location: [facility];  Service: Urology;  Laterality: N/A;  . 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 Suprapubic catheter placement      and removed after 2 years  . Toe surgery Bilateral 2011    2nd and 3rd toe on both feet straightened    Current outpatient prescriptions: B Complex-C (B-COMPLEX WITH VITAMIN C) tablet, Take 1 tablet by mouth daily., Disp: , Rfl: ;   Calcium Carbonate-Vitamin D (CALCIUM + D PO), Take 1 tablet by mouth daily., Disp: , Rfl: ;   ibuprofen (ADVIL,MOTRIN) 200 MG tablet, Take 400 mg by mouth every 6 (six) hours as needed for mild pain or moderate pain. , Disp: , Rfl:  lisinopril (PRINIVIL,ZESTRIL) 10 MG tablet, Take 10 mg by mouth every morning., Disp: , Rfl: ;   Misc Natural Products (OSTEO BI-FLEX ADV DOUBLE ST PO), Take 2 tablets by mouth daily., Disp: , Rfl: ;   Multiple Vitamin (MULTIVITAMIN WITH MINERALS) TABS tablet, Take 1 tablet by mouth daily., Disp: , Rfl:    No Known Allergies  History  Substance Use Topics  . Smoking status: Never Smoker   . Smokeless tobacco: Never Used  . Alcohol Use: Yes     Comment: OCCASIONAL     Review of Systems  Constitutional: Negative.   HENT: Negative.        Wears lenses/glasses  Eyes: Negative.   Respiratory: Negative.   Cardiovascular: Negative.  Gastrointestinal: Positive for constipation. Negative for heartburn, vomiting, abdominal pain, diarrhea, blood in stool and melena.  Genitourinary: Negative for hematuria and flank pain.       Urostomy   Musculoskeletal: Positive for back pain and joint pain. Negative for falls, myalgias and neck pain.       Right hip pain  Skin: Negative.   Neurological: Negative.   Endo/Heme/Allergies: Negative.   Psychiatric/Behavioral: Negative.     Objective:  Physical Exam  Constitutional: He is oriented to person, place, and time. He appears well-developed and well-nourished. No distress.   HENT:  Head: Normocephalic and atraumatic.  Right Ear: External ear normal.  Left Ear: External ear normal.  Nose: Nose normal.  Mouth/Throat: Oropharynx is clear and moist.  Eyes: Conjunctivae and EOM are normal.  Neck: Normal range of motion. Neck supple.  Cardiovascular: Normal rate, regular rhythm, normal heart sounds and intact distal pulses.   No murmur heard. Respiratory: Effort normal and breath sounds normal. No respiratory distress. He has no wheezes.  GI: Soft. Bowel sounds are normal. He exhibits no distension. There is no tenderness.  Genitourinary:  Urostomy  Musculoskeletal:       Right hip: He exhibits decreased range of motion and decreased strength.       Left hip: He exhibits decreased range of motion and decreased strength.       Right knee: Normal.       Left knee: Normal.       Right lower leg: He exhibits no tenderness and no swelling.       Left lower leg: He exhibits no tenderness and no swelling.  His left hip can be flexed to about 100 degrees, rotate in 10 degrees, out 20 degrees, and abduct 20 degrees with discomfort. Right hip flexion about 90 degrees, minimal internal rotation, about 10 degrees external rotation, and 10 degrees abduction.  Neurological: He is alert and oriented to person, place, and time. He has normal strength and normal reflexes. No sensory deficit.  Skin: No rash noted. He is not diaphoretic. No erythema.  Psychiatric: He has a normal mood and affect. His behavior is normal.    Vitals  Weight: 218 lb Height: 74in Body Surface Area: 2.27 m Body Mass Index: 27.99 kg/m Pulse: 84 (Regular)  BP: 138/80 (Sitting, Left Arm, Standard)  Imaging Review Plain radiographs demonstrate severe degenerative joint disease of the right hip(s). The bone quality appears to be adequate for age and reported activity level.  Assessment/Plan:  End stage arthritis, right hip(s)  The patient history, physical examination, clinical  judgement of the provider and imaging studies are consistent with end stage degenerative joint disease of the right hip(s) and total hip arthroplasty is deemed medically necessary. The treatment options including medical management, injection therapy, arthroscopy and arthroplasty were discussed at length. The risks and benefits of total hip arthroplasty were presented and reviewed. The risks due to aseptic loosening, infection, stiffness, dislocation/subluxation,  thromboembolic complications and other imponderables were discussed.  The patient acknowledged the explanation, agreed to proceed with the plan and consent was signed. Patient is being admitted for inpatient treatment for surgery, pain control, PT, OT, prophylactic antibiotics, VTE prophylaxis, progressive ambulation and ADL's and discharge planning.The patient is planning to be discharged home with home health services   Patient to receive TXA  PCP: Dr. Kathryne Eriksson  Ardeen Jourdain, PA-C

## 2014-05-08 ENCOUNTER — Encounter (HOSPITAL_COMMUNITY)
Admission: RE | Disposition: A | Discharge status: Home Health | Payer: Self-pay | Source: Ambulatory Visit | Attending: Orthopedic Surgery

## 2014-05-08 ENCOUNTER — Encounter (HOSPITAL_COMMUNITY): Payer: Self-pay | Admitting: *Deleted

## 2014-05-08 ENCOUNTER — Inpatient Hospital Stay (HOSPITAL_COMMUNITY)
Admission: RE | Admit: 2014-05-08 | Discharge: 2014-05-10 | DRG: 470 | Disposition: A | Discharge status: Home Health | Payer: Medicare Other | Source: Ambulatory Visit | Attending: Orthopedic Surgery | Admitting: Orthopedic Surgery

## 2014-05-08 ENCOUNTER — Inpatient Hospital Stay (HOSPITAL_COMMUNITY): Payer: Medicare Other | Admitting: Anesthesiology

## 2014-05-08 ENCOUNTER — Encounter (HOSPITAL_COMMUNITY): Payer: Medicare Other | Admitting: Anesthesiology

## 2014-05-08 ENCOUNTER — Inpatient Hospital Stay (HOSPITAL_COMMUNITY): Payer: Medicare Other

## 2014-05-08 DIAGNOSIS — Z791 Long term (current) use of non-steroidal anti-inflammatories (NSAID): Secondary | ICD-10-CM | POA: Diagnosis not present

## 2014-05-08 DIAGNOSIS — Z79899 Other long term (current) drug therapy: Secondary | ICD-10-CM | POA: Diagnosis not present

## 2014-05-08 DIAGNOSIS — I10 Essential (primary) hypertension: Secondary | ICD-10-CM | POA: Diagnosis present

## 2014-05-08 DIAGNOSIS — Z87440 Personal history of urinary (tract) infections: Secondary | ICD-10-CM | POA: Diagnosis not present

## 2014-05-08 DIAGNOSIS — Z87442 Personal history of urinary calculi: Secondary | ICD-10-CM | POA: Diagnosis not present

## 2014-05-08 DIAGNOSIS — Z96641 Presence of right artificial hip joint: Secondary | ICD-10-CM

## 2014-05-08 DIAGNOSIS — Z8546 Personal history of malignant neoplasm of prostate: Secondary | ICD-10-CM | POA: Diagnosis not present

## 2014-05-08 DIAGNOSIS — M169 Osteoarthritis of hip, unspecified: Secondary | ICD-10-CM | POA: Diagnosis present

## 2014-05-08 DIAGNOSIS — Z923 Personal history of irradiation: Secondary | ICD-10-CM

## 2014-05-08 DIAGNOSIS — M1610 Unilateral primary osteoarthritis, unspecified hip: Principal | ICD-10-CM | POA: Diagnosis present

## 2014-05-08 DIAGNOSIS — M1611 Unilateral primary osteoarthritis, right hip: Secondary | ICD-10-CM

## 2014-05-08 DIAGNOSIS — M25559 Pain in unspecified hip: Secondary | ICD-10-CM | POA: Diagnosis present

## 2014-05-08 HISTORY — PX: TOTAL HIP ARTHROPLASTY: SHX124

## 2014-05-08 LAB — ABO/RH: ABO/RH(D): A POS

## 2014-05-08 LAB — TYPE AND SCREEN
ABO/RH(D): A POS
ANTIBODY SCREEN: NEGATIVE

## 2014-05-08 SURGERY — ARTHROPLASTY, HIP, TOTAL, ANTERIOR APPROACH
Anesthesia: Spinal | Site: Hip | Laterality: Right

## 2014-05-08 MED ORDER — EPHEDRINE SULFATE 50 MG/ML IJ SOLN
INTRAMUSCULAR | Status: AC
  Filled 2014-05-08: qty 1

## 2014-05-08 MED ORDER — BUPIVACAINE LIPOSOME 1.3 % IJ SUSP
INTRAMUSCULAR | Status: DC | PRN
  Administered 2014-05-08: 20 mL

## 2014-05-08 MED ORDER — TRANEXAMIC ACID 100 MG/ML IV SOLN
1000.0000 mg | INTRAVENOUS | Status: AC
  Administered 2014-05-08: 1000 mg via INTRAVENOUS
  Filled 2014-05-08: qty 10

## 2014-05-08 MED ORDER — FLEET ENEMA 7-19 GM/118ML RE ENEM: 1.0000 | ENEMA | Freq: Once | RECTAL | Status: AC | PRN

## 2014-05-08 MED ORDER — SODIUM CHLORIDE 0.9 % IJ SOLN
INTRAMUSCULAR | Status: AC
  Filled 2014-05-08: qty 50

## 2014-05-08 MED ORDER — PHENYLEPHRINE 40 MCG/ML (10ML) SYRINGE FOR IV PUSH (FOR BLOOD PRESSURE SUPPORT)
PREFILLED_SYRINGE | INTRAVENOUS | Status: AC
  Filled 2014-05-08: qty 10

## 2014-05-08 MED ORDER — PROPOFOL 10 MG/ML IV BOLUS
INTRAVENOUS | Status: AC
  Filled 2014-05-08: qty 20

## 2014-05-08 MED ORDER — LACTATED RINGERS IV SOLN: INTRAVENOUS | Status: DC

## 2014-05-08 MED ORDER — HYDROMORPHONE HCL PF 1 MG/ML IJ SOLN: 0.2500 mg | INTRAMUSCULAR | Status: DC | PRN

## 2014-05-08 MED ORDER — SODIUM CHLORIDE 0.45 % IV SOLN
INTRAVENOUS | Status: DC
  Administered 2014-05-08 – 2014-05-09 (×2): via INTRAVENOUS

## 2014-05-08 MED ORDER — FENTANYL CITRATE 0.05 MG/ML IJ SOLN
INTRAMUSCULAR | Status: AC
  Filled 2014-05-08: qty 2

## 2014-05-08 MED ORDER — METOCLOPRAMIDE HCL 5 MG/ML IJ SOLN: 5.0000 mg | Freq: Three times a day (TID) | INTRAMUSCULAR | Status: DC | PRN

## 2014-05-08 MED ORDER — ONDANSETRON HCL 4 MG/2ML IJ SOLN
INTRAMUSCULAR | Status: AC
  Filled 2014-05-08: qty 2

## 2014-05-08 MED ORDER — SODIUM CHLORIDE 0.9 % IJ SOLN
INTRAMUSCULAR | Status: DC | PRN
  Administered 2014-05-08: 20 mL

## 2014-05-08 MED ORDER — RIVAROXABAN 10 MG PO TABS
10.0000 mg | ORAL_TABLET | Freq: Every day | ORAL | Status: DC
  Administered 2014-05-09 – 2014-05-10 (×2): 10 mg via ORAL
  Filled 2014-05-08 (×3): qty 1

## 2014-05-08 MED ORDER — PROMETHAZINE HCL 25 MG/ML IJ SOLN: 6.2500 mg | INTRAMUSCULAR | Status: DC | PRN

## 2014-05-08 MED ORDER — DOCUSATE SODIUM 100 MG PO CAPS
100.0000 mg | ORAL_CAPSULE | Freq: Two times a day (BID) | ORAL | Status: DC
  Administered 2014-05-08 – 2014-05-10 (×4): 100 mg via ORAL

## 2014-05-08 MED ORDER — TRAMADOL HCL 50 MG PO TABS: 50.0000 mg | ORAL_TABLET | Freq: Four times a day (QID) | ORAL | Status: DC | PRN

## 2014-05-08 MED ORDER — METOCLOPRAMIDE HCL 10 MG PO TABS: 5.0000 mg | ORAL_TABLET | Freq: Three times a day (TID) | ORAL | Status: DC | PRN

## 2014-05-08 MED ORDER — DEXAMETHASONE SODIUM PHOSPHATE 10 MG/ML IJ SOLN
10.0000 mg | Freq: Every day | INTRAMUSCULAR | Status: AC
  Filled 2014-05-08: qty 1

## 2014-05-08 MED ORDER — ONDANSETRON HCL 4 MG/2ML IJ SOLN
INTRAMUSCULAR | Status: DC | PRN
  Administered 2014-05-08: 4 mg via INTRAVENOUS

## 2014-05-08 MED ORDER — METHOCARBAMOL 500 MG PO TABS
500.0000 mg | ORAL_TABLET | Freq: Four times a day (QID) | ORAL | Status: DC | PRN
  Administered 2014-05-08 – 2014-05-10 (×6): 500 mg via ORAL
  Filled 2014-05-08 (×6): qty 1

## 2014-05-08 MED ORDER — BUPIVACAINE HCL (PF) 0.25 % IJ SOLN
INTRAMUSCULAR | Status: AC
  Filled 2014-05-08: qty 30

## 2014-05-08 MED ORDER — KETOROLAC TROMETHAMINE 15 MG/ML IJ SOLN
7.5000 mg | Freq: Four times a day (QID) | INTRAMUSCULAR | Status: AC | PRN
  Administered 2014-05-08 (×2): 7.5 mg via INTRAVENOUS
  Filled 2014-05-08: qty 1

## 2014-05-08 MED ORDER — CEFAZOLIN SODIUM-DEXTROSE 2-3 GM-% IV SOLR
INTRAVENOUS | Status: AC
  Filled 2014-05-08: qty 50

## 2014-05-08 MED ORDER — ATROPINE SULFATE 0.4 MG/ML IJ SOLN
INTRAMUSCULAR | Status: AC
  Filled 2014-05-08: qty 1

## 2014-05-08 MED ORDER — BUPIVACAINE HCL (PF) 0.5 % IJ SOLN
INTRAMUSCULAR | Status: DC | PRN
  Administered 2014-05-08: 3 mL

## 2014-05-08 MED ORDER — DIPHENHYDRAMINE HCL 12.5 MG/5ML PO ELIX: 12.5000 mg | ORAL_SOLUTION | ORAL | Status: DC | PRN

## 2014-05-08 MED ORDER — LACTATED RINGERS IV SOLN
INTRAVENOUS | Status: DC | PRN
  Administered 2014-05-08 (×2): via INTRAVENOUS

## 2014-05-08 MED ORDER — MIDAZOLAM HCL 5 MG/5ML IJ SOLN
INTRAMUSCULAR | Status: DC | PRN
  Administered 2014-05-08: 2 mg via INTRAVENOUS

## 2014-05-08 MED ORDER — MORPHINE SULFATE 2 MG/ML IJ SOLN: 1.0000 mg | INTRAMUSCULAR | Status: DC | PRN

## 2014-05-08 MED ORDER — BISACODYL 10 MG RE SUPP: 10.0000 mg | Freq: Every day | RECTAL | Status: DC | PRN

## 2014-05-08 MED ORDER — FENTANYL CITRATE 0.05 MG/ML IJ SOLN
INTRAMUSCULAR | Status: DC | PRN
  Administered 2014-05-08 (×2): 50 ug via INTRAVENOUS

## 2014-05-08 MED ORDER — ONDANSETRON HCL 4 MG/2ML IJ SOLN: 4.0000 mg | Freq: Four times a day (QID) | INTRAMUSCULAR | Status: DC | PRN

## 2014-05-08 MED ORDER — CEFAZOLIN SODIUM-DEXTROSE 2-3 GM-% IV SOLR
2.0000 g | Freq: Four times a day (QID) | INTRAVENOUS | Status: AC
  Administered 2014-05-08 (×2): 2 g via INTRAVENOUS
  Filled 2014-05-08 (×2): qty 50

## 2014-05-08 MED ORDER — DEXAMETHASONE SODIUM PHOSPHATE 10 MG/ML IJ SOLN
INTRAMUSCULAR | Status: AC
  Filled 2014-05-08: qty 1

## 2014-05-08 MED ORDER — KETAMINE HCL 10 MG/ML IJ SOLN
INTRAMUSCULAR | Status: DC | PRN
Start: 2014-05-08 — End: 2014-05-08
  Administered 2014-05-08 (×2): 10 mg via INTRAVENOUS
  Administered 2014-05-08: 20 mg via INTRAVENOUS

## 2014-05-08 MED ORDER — BUPIVACAINE LIPOSOME 1.3 % IJ SUSP
20.0000 mL | Freq: Once | INTRAMUSCULAR | Status: DC
  Filled 2014-05-08: qty 20

## 2014-05-08 MED ORDER — ACETAMINOPHEN 325 MG PO TABS: 650.0000 mg | ORAL_TABLET | Freq: Four times a day (QID) | ORAL | Status: DC | PRN

## 2014-05-08 MED ORDER — POLYETHYLENE GLYCOL 3350 17 G PO PACK: 17.0000 g | PACK | Freq: Every day | ORAL | Status: DC | PRN

## 2014-05-08 MED ORDER — LACTATED RINGERS IV SOLN
INTRAVENOUS | Status: DC | PRN
  Administered 2014-05-08 (×3): via INTRAVENOUS

## 2014-05-08 MED ORDER — ACETAMINOPHEN 650 MG RE SUPP: 650.0000 mg | Freq: Four times a day (QID) | RECTAL | Status: DC | PRN

## 2014-05-08 MED ORDER — SODIUM CHLORIDE 0.9 % IJ SOLN
INTRAMUSCULAR | Status: AC
  Filled 2014-05-08: qty 10

## 2014-05-08 MED ORDER — PROPOFOL 10 MG/ML IV BOLUS
INTRAVENOUS | Status: DC | PRN
  Administered 2014-05-08 (×2): 20 mg via INTRAVENOUS
  Administered 2014-05-08: 50 mg via INTRAVENOUS

## 2014-05-08 MED ORDER — SODIUM CHLORIDE 0.9 % IV SOLN
INTRAVENOUS | Status: DC
Start: 2014-05-08 — End: 2014-05-08

## 2014-05-08 MED ORDER — KETAMINE HCL 10 MG/ML IJ SOLN
INTRAMUSCULAR | Status: AC
  Filled 2014-05-08: qty 1

## 2014-05-08 MED ORDER — DEXAMETHASONE 6 MG PO TABS
10.0000 mg | ORAL_TABLET | Freq: Every day | ORAL | Status: AC
  Administered 2014-05-09: 10 mg via ORAL
  Filled 2014-05-08: qty 1

## 2014-05-08 MED ORDER — OXYCODONE HCL 5 MG PO TABS
5.0000 mg | ORAL_TABLET | ORAL | Status: DC | PRN
  Administered 2014-05-08: 5 mg via ORAL
  Administered 2014-05-08 (×2): 10 mg via ORAL
  Administered 2014-05-08: 5 mg via ORAL
  Administered 2014-05-09 (×2): 10 mg via ORAL
  Filled 2014-05-08 (×2): qty 1
  Filled 2014-05-08 (×4): qty 2

## 2014-05-08 MED ORDER — CEFAZOLIN SODIUM-DEXTROSE 2-3 GM-% IV SOLR
2.0000 g | INTRAVENOUS | Status: AC
  Administered 2014-05-08: 2 g via INTRAVENOUS

## 2014-05-08 MED ORDER — BUPIVACAINE HCL (PF) 0.25 % IJ SOLN
INTRAMUSCULAR | Status: DC | PRN
  Administered 2014-05-08: 20 mL

## 2014-05-08 MED ORDER — PHENOL 1.4 % MT LIQD
1.0000 | OROMUCOSAL | Status: DC | PRN
  Filled 2014-05-08: qty 177

## 2014-05-08 MED ORDER — ACETAMINOPHEN 500 MG PO TABS
1000.0000 mg | ORAL_TABLET | Freq: Four times a day (QID) | ORAL | Status: AC
  Administered 2014-05-08 – 2014-05-09 (×3): 1000 mg via ORAL
  Filled 2014-05-08 (×4): qty 2

## 2014-05-08 MED ORDER — MENTHOL 3 MG MT LOZG
1.0000 | LOZENGE | OROMUCOSAL | Status: DC | PRN
  Filled 2014-05-08: qty 9

## 2014-05-08 MED ORDER — PHENYLEPHRINE HCL 10 MG/ML IJ SOLN
INTRAMUSCULAR | Status: DC | PRN
  Administered 2014-05-08: 40 ug via INTRAVENOUS
  Administered 2014-05-08 (×7): 80 ug via INTRAVENOUS

## 2014-05-08 MED ORDER — MIDAZOLAM HCL 2 MG/2ML IJ SOLN
INTRAMUSCULAR | Status: AC
  Filled 2014-05-08: qty 2

## 2014-05-08 MED ORDER — DEXAMETHASONE SODIUM PHOSPHATE 10 MG/ML IJ SOLN
10.0000 mg | Freq: Once | INTRAMUSCULAR | Status: AC
  Administered 2014-05-08: 10 mg via INTRAVENOUS

## 2014-05-08 MED ORDER — ONDANSETRON HCL 4 MG PO TABS: 4.0000 mg | ORAL_TABLET | Freq: Four times a day (QID) | ORAL | Status: DC | PRN

## 2014-05-08 MED ORDER — PROPOFOL INFUSION 10 MG/ML OPTIME
INTRAVENOUS | Status: DC | PRN
  Administered 2014-05-08: 75 ug/kg/min via INTRAVENOUS

## 2014-05-08 MED ORDER — KETOROLAC TROMETHAMINE 15 MG/ML IJ SOLN
INTRAMUSCULAR | Status: AC
  Filled 2014-05-08: qty 1

## 2014-05-08 MED ORDER — ACETAMINOPHEN 10 MG/ML IV SOLN
1000.0000 mg | Freq: Once | INTRAVENOUS | Status: AC
  Administered 2014-05-08: 1000 mg via INTRAVENOUS
  Filled 2014-05-08: qty 100

## 2014-05-08 MED ORDER — METHOCARBAMOL 1000 MG/10ML IJ SOLN
500.0000 mg | Freq: Four times a day (QID) | INTRAVENOUS | Status: DC | PRN
  Administered 2014-05-08: 500 mg via INTRAVENOUS
  Filled 2014-05-08: qty 5

## 2014-05-08 MED ORDER — 0.9 % SODIUM CHLORIDE (POUR BTL) OPTIME
TOPICAL | Status: DC | PRN
  Administered 2014-05-08: 1000 mL

## 2014-05-08 MED ORDER — MEPERIDINE HCL 50 MG/ML IJ SOLN: 6.2500 mg | INTRAMUSCULAR | Status: DC | PRN

## 2014-05-08 SURGICAL SUPPLY — 44 items
BAG ZIPLOCK 12X15 (MISCELLANEOUS) IMPLANT
BLADE EXTENDED COATED 6.5IN (ELECTRODE) ×2 IMPLANT
BLADE SAG 18X100X1.27 (BLADE) ×2 IMPLANT
CAPT HIP PF COP ×2 IMPLANT
COVER PERINEAL POST (MISCELLANEOUS) ×2 IMPLANT
DECANTER SPIKE VIAL GLASS SM (MISCELLANEOUS) ×2 IMPLANT
DRAPE C-ARM 42X120 X-RAY (DRAPES) ×2 IMPLANT
DRAPE STERI IOBAN 125X83 (DRAPES) ×2 IMPLANT
DRAPE U-SHAPE 47X51 STRL (DRAPES) ×6 IMPLANT
DRSG ADAPTIC 3X8 NADH LF (GAUZE/BANDAGES/DRESSINGS) ×2 IMPLANT
DRSG MEPILEX BORDER 4X4 (GAUZE/BANDAGES/DRESSINGS) ×2 IMPLANT
DRSG MEPILEX BORDER 4X8 (GAUZE/BANDAGES/DRESSINGS) ×2 IMPLANT
DURAPREP 26ML APPLICATOR (WOUND CARE) ×2 IMPLANT
ELECT REM PT RETURN 9FT ADLT (ELECTROSURGICAL) ×2
ELECTRODE REM PT RTRN 9FT ADLT (ELECTROSURGICAL) ×1 IMPLANT
EVACUATOR 1/8 PVC DRAIN (DRAIN) ×2 IMPLANT
FACESHIELD WRAPAROUND (MASK) ×8 IMPLANT
GAUZE SPONGE 4X4 12PLY STRL (GAUZE/BANDAGES/DRESSINGS) IMPLANT
GLOVE BIO SURGEON STRL SZ7.5 (GLOVE) ×4 IMPLANT
GLOVE BIO SURGEON STRL SZ8 (GLOVE) ×4 IMPLANT
GLOVE BIOGEL PI IND STRL 6.5 (GLOVE) ×1 IMPLANT
GLOVE BIOGEL PI IND STRL 7.5 (GLOVE) ×2 IMPLANT
GLOVE BIOGEL PI IND STRL 8 (GLOVE) ×2 IMPLANT
GLOVE BIOGEL PI INDICATOR 6.5 (GLOVE) ×1
GLOVE BIOGEL PI INDICATOR 7.5 (GLOVE) ×2
GLOVE BIOGEL PI INDICATOR 8 (GLOVE) ×2
GLOVE SURG SS PI 7.5 STRL IVOR (GLOVE) ×2 IMPLANT
GOWN BRE IMP PREV XXLGXLNG (GOWN DISPOSABLE) ×2 IMPLANT
GOWN STRL REUS W/TWL LRG LVL3 (GOWN DISPOSABLE) ×2 IMPLANT
GOWN STRL REUS W/TWL XL LVL3 (GOWN DISPOSABLE) ×4 IMPLANT
KIT BASIN OR (CUSTOM PROCEDURE TRAY) ×2 IMPLANT
NDL SAFETY ECLIPSE 18X1.5 (NEEDLE) ×2 IMPLANT
NEEDLE HYPO 18GX1.5 SHARP (NEEDLE) ×2
PACK TOTAL JOINT (CUSTOM PROCEDURE TRAY) ×2 IMPLANT
STRIP CLOSURE SKIN 1/2X4 (GAUZE/BANDAGES/DRESSINGS) ×2 IMPLANT
SUT ETHIBOND NAB CT1 #1 30IN (SUTURE) ×2 IMPLANT
SUT MNCRL AB 4-0 PS2 18 (SUTURE) ×2 IMPLANT
SUT VIC AB 2-0 CT1 27 (SUTURE) ×3
SUT VIC AB 2-0 CT1 TAPERPNT 27 (SUTURE) ×3 IMPLANT
SUT VLOC 180 0 24IN GS25 (SUTURE) ×2 IMPLANT
SYR 20CC LL (SYRINGE) ×2 IMPLANT
SYR 50ML LL SCALE MARK (SYRINGE) ×2 IMPLANT
TOWEL OR 17X26 10 PK STRL BLUE (TOWEL DISPOSABLE) ×2 IMPLANT
YANKAUER SUCT BULB TIP NO VENT (SUCTIONS) ×2 IMPLANT

## 2014-05-08 NOTE — Transfer of Care (Signed)
Immediate Anesthesia Transfer of Care Note  Patient: Kevin Stafford  Procedure(s) Performed: Procedure(s): RIGHT TOTAL HIP ARTHROPLASTY ANTERIOR APPROACH (Right)  Patient Location: PACU  Anesthesia Type:Spinal  Level of Consciousness: sedated  Airway & Oxygen Therapy: Patient Spontanous Breathing and Patient connected to face mask oxygen  Post-op Assessment: Report given to PACU RN and Post -op Vital signs reviewed and stable  Post vital signs: Reviewed and stable  Complications: No apparent anesthesia complications

## 2014-05-08 NOTE — Op Note (Signed)
OPERATIVE REPORT  PREOPERATIVE DIAGNOSIS: Osteoarthritis of the Right hip.   POSTOPERATIVE DIAGNOSIS: Osteoarthritis of the Right  hip.   PROCEDURE: Right total hip arthroplasty, anterior approach.   SURGEON: Gaynelle Arabian, MD   ASSISTANT: Arlee Muslim, PA-C  ANESTHESIA:  Spinal  ESTIMATED BLOOD LOSS:- 300 ml  DRAINS: Hemovac x1.   COMPLICATIONS: None   CONDITION: PACU - hemodynamically stable.   BRIEF CLINICAL NOTE: Kevin Stafford is a 68 y.o. male who has advanced end-  stage arthritis of his Right  hip with progressively worsening pain and  dysfunction.The patient has failed nonoperative management and presents for  total hip arthroplasty.   PROCEDURE IN DETAIL: After successful administration of spinal  anesthetic, the traction boots for the [employer] bed were placed on both  feet and the patient was placed onto the [facility] bed, boots placed into the leg  holders. The Right hip was then isolated from the perineum with plastic  drapes and prepped and draped in the usual sterile fashion. ASIS and  greater trochanter were marked and a oblique incision was made, starting  at about 1 cm lateral and 2 cm distal to the ASIS and coursing towards  the anterior cortex of the femur. The skin was cut with a 10 blade  through subcutaneous tissue to the level of the fascia overlying the  tensor fascia lata muscle. The fascia was then incised in line with the  incision at the junction of the anterior third and posterior 2/3rd. The  muscle was teased off the fascia and then the interval between the TFL  and the rectus was developed. The Hohmann retractor was then placed at  the top of the femoral neck over the capsule. The vessels overlying the  capsule were cauterized and the fat on top of the capsule was removed.  A Hohmann retractor was then placed anterior underneath the rectus  femoris to give exposure to the entire anterior capsule. A T-shaped  capsulotomy was performed. The  edges were tagged and the femoral head  was identified.       Osteophytes are removed off the superior acetabulum.  The femoral neck was then cut in situ with an oscillating saw. Traction  was then applied to the left lower extremity utilizing the [facility]  traction. The femoral head was then removed. Retractors were placed  around the acetabulum and then circumferential removal of the labrum was  performed. Osteophytes were also removed. Reaming starts at 47 mm to  medialize and  Increased in 2 mm increments to 53 mm. We reamed in  approximately 40 degrees of abduction, 20 degrees anteversion. A 54 mm  pinnacle acetabular shell was then impacted in anatomic position under  fluoroscopic guidance with excellent purchase. We did not need to place  any additional dome screws. A 36 mm neutral + 4 marathon liner was then  placed into the acetabular shell.       The femoral lift was then placed along the lateral aspect of the femur  just distal to the vastus ridge. The leg was  externally rotated and capsule  was stripped off the inferior aspect of the femoral neck down to the  level of the lesser trochanter, this was done with electrocautery. The femur was lifted after this was performed. The  leg was then placed and extended in adducted position to essentially delivering the femur. We also removed the capsule superiorly and the  piriformis from the piriformis  fossa to gain excellent exposure of the  proximal femur. Rongeur was used to remove some cancellous bone to get  into the lateral portion of the proximal femur for placement of the  initial starter reamer. The starter broaches was placed  the starter broach  and was shown to go down the center of the canal. Broaching  with the  Corail system was then performed starting at size 8, coursing  Up to size 12. A size 12 had excellent torsional and rotational  and axial stability. The trial standard offset neck was then placed  with a 36 + 1.5 trial  head. The hip was then reduced. We confirmed that  the stem was in the canal both on AP and lateral x-rays. It also has excellent sizing. The hip was reduced with outstanding stability through full extension, full external rotation,  and then flexion in adduction internal rotation. AP pelvis was taken  and the leg lengths were measured and found to be exactly equal. Hip  was then dislocated again and the femoral head and neck removed. The  femoral broach was removed. Size 12 Corail stem with a standard offset  neck was then impacted into the femur following native anteversion. Has  excellent purchase in the canal. Excellent torsional and rotational and  axial stability. It is confirmed to be in the canal on AP and lateral  fluoroscopic views. The 36 + 1.5 ceramic head was placed and the hip  reduced with outstanding stability. Again AP pelvis was taken and it  confirmed that the leg lengths were equal. The wound was then copiously  irrigated with saline solution and the capsule reattached and repaired  with Ethibond suture.  20 mL of Exparel mixed with 50 mL of saline then additional 20 ml of .25% Bupivicaine injected into the capsule and into the edge of the tensor fascia lata as well as subcutaneous tissue. The fascia overlying the tensor fascia lata was  then closed with a running #1 V-Loc. Subcu was closed with interrupted  2-0 Vicryl and subcuticular running 4-0 Monocryl. Incision was cleaned  and dried. Steri-Strips and a bulky sterile dressing applied. Hemovac  drain was hooked to suction and then he was awakened and transported to  recovery in stable condition.        Please note that a surgical assistant was a medical necessity for this procedure to perform it in a safe and expeditious manner. Assistant was necessary to provide appropriate retraction of vital neurovascular structures and to prevent femoral fracture and allow for anatomic placement of the prosthesis.  Gaynelle Arabian, M.D.

## 2014-05-08 NOTE — Progress Notes (Signed)
Utilization review completed.  

## 2014-05-08 NOTE — Anesthesia Procedure Notes (Signed)
Spinal  Patient location during procedure: OR Start time: 05/08/2014 8:34 AM End time: 05/08/2014 8:38 AM Staffing Anesthesiologist: Montez Hageman CRNA/Resident: Darlys Gales R Performed by: resident/CRNA  Preanesthetic Checklist Completed: patient identified, site marked, surgical consent, pre-op evaluation, timeout performed, IV checked, risks and benefits discussed and monitors and equipment checked Spinal Block Patient position: sitting Prep: Betadine Patient monitoring: heart rate, cardiac monitor, continuous pulse ox and blood pressure Approach: midline Location: L2-3 Injection technique: single-shot Needle Needle type: Spinocan  Needle gauge: 22 G Needle length: 9 cm Needle insertion depth: 7.5 cm Assessment Sensory level: T4

## 2014-05-08 NOTE — Anesthesia Preprocedure Evaluation (Addendum)
Anesthesia Evaluation  Patient identified by MRN, date of birth, ID band Patient awake    Reviewed: Allergy & Precautions, H&P , NPO status , Patient's Chart, lab work & pertinent test results  Airway Mallampati: III TM Distance: >3 FB Neck ROM: Full    Dental no notable dental hx.    Pulmonary neg pulmonary ROS,  breath sounds clear to auscultation  Pulmonary exam normal       Cardiovascular hypertension, Pt. on medications Rhythm:Regular Rate:Normal     Neuro/Psych negative neurological ROS  negative psych ROS   GI/Hepatic negative GI ROS, Neg liver ROS,   Endo/Other  negative endocrine ROS  Renal/GU negative Renal ROS  negative genitourinary   Musculoskeletal negative musculoskeletal ROS (+)   Abdominal   Peds negative pediatric ROS (+)  Hematology negative hematology ROS (+)   Anesthesia Other Findings   Reproductive/Obstetrics negative OB ROS                         Anesthesia Physical Anesthesia Plan  ASA: II  Anesthesia Plan: Spinal   Post-op Pain Management:    Induction:   Airway Management Planned: Simple Face Mask  Additional Equipment:   Intra-op Plan:   Post-operative Plan:   Informed Consent: I have reviewed the patients History and Physical, chart, labs and discussed the procedure including the risks, benefits and alternatives for the proposed anesthesia with the patient or authorized representative who has indicated his/her understanding and acceptance.   Dental advisory given  Plan Discussed with: CRNA  Anesthesia Plan Comments:         Anesthesia Quick Evaluation                                  Anesthesia Evaluation  Patient identified by MRN, date of birth, ID band Patient awake    Reviewed: Allergy & Precautions, H&P , NPO status , Patient's Chart, lab work & pertinent test results, reviewed documented beta blocker date and time    Airway Mallampati: II TM Distance: >3 FB Neck ROM: Full    Dental  (+) Teeth Intact   Pulmonary neg pulmonary ROS,  clear to auscultation        Cardiovascular neg cardio ROS Regular Normal Denies cardiac symptoms   Neuro/Psych Negative Neurological ROS  Negative Psych ROS   GI/Hepatic negative GI ROS, Neg liver ROS,   Endo/Other  Negative Endocrine ROS  Renal/GU negative Renal ROS   Prostate cancer    Musculoskeletal negative musculoskeletal ROS (+)   Abdominal   Peds negative pediatric ROS (+)  Hematology negative hematology ROS (+)   Anesthesia Other Findings   Reproductive/Obstetrics negative OB ROS                           Anesthesia Physical  Anesthesia Plan  ASA: II  Anesthesia Plan: General   Post-op Pain Management:    Induction: Intravenous  Airway Management Planned: LMA  Additional Equipment:   Intra-op Plan:   Post-operative Plan: Extubation in OR  Informed Consent:   Plan Discussed with: CRNA and Surgeon  Anesthesia Plan Comments:         Anesthesia Quick Evaluation

## 2014-05-08 NOTE — Interval H&P Note (Signed)
History and Physical Interval Note:  05/08/2014 8:08 AM  Kevin Stafford  has presented today for surgery, with the diagnosis of OA RIGHT HIP  The various methods of treatment have been discussed with the patient and family. After consideration of risks, benefits and other options for treatment, the patient has consented to  Procedure(s): RIGHT TOTAL HIP ARTHROPLASTY ANTERIOR APPROACH (Right) as a surgical intervention .  The patient's history has been reviewed, patient examined, no change in status, stable for surgery.  I have reviewed the patient's chart and labs.  Questions were answered to the patient's satisfaction.     Gearlean Alf

## 2014-05-08 NOTE — Evaluation (Signed)
Physical Therapy Evaluation Patient Details Name: Kevin Stafford MRN: [id number] DOB: [birth year] Today's Date: 05/08/2014   History of Present Illness  RDATHA  Clinical Impression  Pt mobilized very well, min c/o soreness. Pt will benefit from PT to address problems listed in note.    Follow Up Recommendations Home health PT;Supervision/Assistance - 24 hour    Equipment Recommendations  None recommended by PT    Recommendations for Other Services       Precautions / Restrictions Precautions Precautions: Fall Precaution Comments: suprapubic catheter      Mobility  Bed Mobility Overal bed mobility: Needs Assistance Bed Mobility: Sit to Supine     Supine to sit: Min assist Sit to supine: Min assist   General bed mobility comments: support R leg, cues for technique  Transfers Overall transfer level: Needs assistance Equipment used: Rolling walker (2 wheeled) Transfers: Sit to/from Stand Sit to Stand: Min assist;From elevated surface         General transfer comment: cues for hand and r leg placement  Ambulation/Gait Ambulation/Gait assistance: Min assist Ambulation Distance (Feet): 180 Feet Assistive device: Rolling walker (2 wheeled) Gait Pattern/deviations: Step-through pattern     General Gait Details: pt  has no antalgic gait, very smoothe swing, cues for posture, tends to look down  Stairs            Wheelchair Mobility    Modified Rankin (Stroke Patients Only)       Balance                                             Pertinent Vitals/Pain Pain Assessment: 0-10 Pain Score: 1  Pain Descriptors / Indicators: Sore Pain Intervention(s): Premedicated before session;Ice applied    Home Living Family/patient expects to be discharged to:: Private residence Living Arrangements: Spouse/significant other;Children Available Help at Discharge: Family Type of Home: House Home Access: Stairs to enter Entrance Stairs-Rails:  Left Entrance Stairs-Number of Steps: 2 Home Layout: Able to live on main level with bedroom/bathroom Home Equipment: Walker - 2 wheels;Bedside commode      Prior Function Level of Independence: Independent               Hand Dominance        Extremity/Trunk Assessment   Upper Extremity Assessment: Overall WFL for tasks assessed           Lower Extremity Assessment: RLE deficits/detail RLE Deficits / Details: active hip flexion to 60 in supine       Communication   Communication: No difficulties  Cognition Arousal/Alertness: Awake/alert Behavior During Therapy: WFL for tasks assessed/performed Overall Cognitive Status: Within Functional Limits for tasks assessed                      General Comments      Exercises Total Joint Exercises Heel Slides: AROM;Right;5 reps;Supine      Assessment/Plan    PT Assessment Patient needs continued PT services  PT Diagnosis Difficulty walking   PT Problem List Decreased strength;Decreased knowledge of use of DME;Decreased safety awareness;Decreased knowledge of precautions  PT Treatment Interventions DME instruction;Gait training;Stair training;Functional mobility training;Therapeutic activities;Patient/family education   PT Goals (Current goals can be found in the Care Plan section) Acute Rehab PT Goals Patient Stated Goal: to play golf PT Goal Formulation: With patient/family Time For Goal Achievement: 05/11/14 Potential to Achieve  Goals: Good    Frequency 7X/week   Barriers to discharge        Co-evaluation               End of Session   Activity Tolerance: Patient tolerated treatment well Patient left: in bed;with call bell/phone within reach;with family/visitor present Nurse Communication: Mobility status         Time: 9774-1423 PT Time Calculation (min): 15 min   Charges:   PT Evaluation $Initial PT Evaluation Tier I: 1 Procedure PT Treatments $Gait Training: 8-22 mins   PT  G Codes:          Claretha Cooper 05/08/2014, 5:30 PM Tresa Endo PT [phone]

## 2014-05-08 NOTE — Plan of Care (Signed)
Problem: Consults Goal: Diagnosis- Total Joint Replacement Primary Total Hip     

## 2014-05-08 NOTE — Anesthesia Postprocedure Evaluation (Signed)
  Anesthesia Post-op Note  Patient: Kevin Stafford  Procedure(s) Performed: Procedure(s) (LRB): RIGHT TOTAL HIP ARTHROPLASTY ANTERIOR APPROACH (Right)  Patient Location: PACU  Anesthesia Type: Spinal  Level of Consciousness: awake and alert   Airway and Oxygen Therapy: Patient Spontanous Breathing  Post-op Pain: mild  Post-op Assessment: Post-op Vital signs reviewed, Patient's Cardiovascular Status Stable, Respiratory Function Stable, Patent Airway and No signs of Nausea or vomiting  Last Vitals:  Filed Vitals:   05/08/14 1300  BP: 122/80  Pulse: 70  Temp: 37 C  Resp: 16    Post-op Vital Signs: stable   Complications: No apparent anesthesia complications

## 2014-05-09 ENCOUNTER — Encounter (HOSPITAL_COMMUNITY): Payer: Self-pay | Admitting: Orthopedic Surgery

## 2014-05-09 LAB — CBC
HCT: 32.9 % — ABNORMAL LOW (ref 39.0–52.0)
Hemoglobin: 10.9 g/dL — ABNORMAL LOW (ref 13.0–17.0)
MCH: 27.8 pg (ref 26.0–34.0)
MCHC: 33.1 g/dL (ref 30.0–36.0)
MCV: 83.9 fL (ref 78.0–100.0)
PLATELETS: 204 10*3/uL (ref 150–400)
RBC: 3.92 MIL/uL — ABNORMAL LOW (ref 4.22–5.81)
RDW: 15.5 % (ref 11.5–15.5)
WBC: 10.7 10*3/uL — AB (ref 4.0–10.5)

## 2014-05-09 LAB — BASIC METABOLIC PANEL
ANION GAP: 11 (ref 5–15)
BUN: 18 mg/dL (ref 6–23)
CHLORIDE: 101 meq/L (ref 96–112)
CO2: 22 meq/L (ref 19–32)
Calcium: 8.5 mg/dL (ref 8.4–10.5)
Creatinine, Ser: 0.93 mg/dL (ref 0.50–1.35)
GFR calc non Af Amer: 86 mL/min — ABNORMAL LOW (ref >90)
Glucose, Bld: 125 mg/dL — ABNORMAL HIGH (ref 70–99)
POTASSIUM: 4.4 meq/L (ref 3.7–5.3)
Sodium: 134 mEq/L — ABNORMAL LOW (ref 137–147)

## 2014-05-09 MED ORDER — OXYCODONE HCL 5 MG PO TABS
5.0000 mg | ORAL_TABLET | ORAL | Status: DC | PRN
  Administered 2014-05-09 – 2014-05-10 (×5): 15 mg via ORAL
  Filled 2014-05-09 (×5): qty 3

## 2014-05-09 NOTE — Progress Notes (Addendum)
Physical Therapy Treatment Patient Details Name: Kevin Stafford MRN: [id number] DOB: 25-Jul-1946 Today's Date: 05/09/2014    History of Present Illness RDATHA    PT Comments    POD # 1 am session.  Assisted pt OOB using a belt loop around R LE and educated pt on use as a "leg lifter".  Amb in hallway a great distance without c/o.  Returned to room to perform all THR TE's following HEP handout.  Ended in bed with ICE to R hip.    Follow Up Recommendations  Home health PT;Supervision/Assistance - 24 hour     Equipment Recommendations  None recommended by PT    Recommendations for Other Services       Precautions / Restrictions Precautions Precautions: Fall Precaution Comments: suprapubic catheter Restrictions Weight Bearing Restrictions: No    Mobility  Bed Mobility Overal bed mobility: Needs Assistance Bed Mobility: Supine to Sit;Sit to Supine     Supine to sit: Min guard Sit to supine: Min guard   General bed mobility comments: demonstrated and instructed pt how to use a belt to assist R LE off/on bed  Transfers Overall transfer level: Needs assistance Equipment used: Rolling walker (2 wheeled) Transfers: Sit to/from Stand Sit to Stand: Min guard         General transfer comment: 25% VC's on proper tech and hand placement  Ambulation/Gait Ambulation/Gait assistance: Min guard;Min assist Ambulation Distance (Feet): 225 Feet Assistive device: Rolling walker (2 wheeled) Gait Pattern/deviations: Step-to pattern;Step-through pattern;Trunk flexed Gait velocity: decreased   General Gait Details: <25% VC's on safety with turns and increased time.  Pt tends to took down 90% of time.   Stairs            Wheelchair Mobility    Modified Rankin (Stroke Patients Only)       Balance                                    Cognition Arousal/Alertness: Awake/alert Behavior During Therapy: WFL for tasks assessed/performed Overall Cognitive Status:  Within Functional Limits for tasks assessed                      Exercises   Total Hip Replacement TE's 10 reps ankle pumps 10 reps knee presses 10 reps heel slides 10 reps SAQ's 10 reps ABD Followed by ICE     General Comments        Pertinent Vitals/Pain Pain Assessment: 0-10 Pain Score: 8  Pain Location: R hip Pain Descriptors / Indicators: Aching Pain Intervention(s): Repositioned;Ice applied    Home Living Family/patient expects to be discharged to:: Private residence Living Arrangements: Spouse/significant other;Children Available Help at Discharge: Family Type of Home: House Home Access: Stairs to enter Entrance Stairs-Rails: Left Home Layout: Able to live on main level with bedroom/bathroom Home Equipment: Walker - 2 wheels;Bedside commode;Grab bars - tub/shower;Shower seat - built in      Prior Function Level of Independence: Independent          PT Goals (current goals can now be found in the care plan section) Acute Rehab PT Goals Patient Stated Goal: return to independence. Progress towards PT goals: Progressing toward goals    Frequency  7X/week    PT Plan      Co-evaluation             End of Session Equipment Utilized During Treatment: Gait belt  Activity Tolerance: Patient tolerated treatment well Patient left: in chair;with call bell/phone within reach     Time: 0900-0933 PT Time Calculation (min): 33 min  Charges:  $Gait Training: 8-22 mins $Therapeutic Exercise: 8-22 mins                    G Codes:      Rica Koyanagi  PTA [facility]  Acute  Rehab Pager      [phone]

## 2014-05-09 NOTE — Care Management Note (Signed)
    Page 1 of 2   05/09/2014     11:14:12 AM CARE MANAGEMENT NOTE 05/09/2014  Patient:  Kevin Stafford, Kevin Stafford   Account Number:  [id number]  Date Initiated:  05/09/2014  Documentation initiated by:  [facility]  Subjective/Objective Assessment:   adm: RIGHT TOTAL HIP ARTHROPLASTY ANTERIOR APPROACH (Right)     Action/Plan:   discharge planning   Anticipated DC Date:  05/10/2014   Anticipated DC Plan:  [place]  CM consult      [facility] Choice  HOME HEALTH   Choice offered to / List presented to:  C-1 Patient        [place] arranged  HH-2 PT      [place]   Status of service:  Completed, signed off Medicare Important Message given?   (If response is "NO", the following Medicare IM given date fields will be blank) Date Medicare IM given:   Medicare IM given by:   Date Additional Medicare IM given:   Additional Medicare IM given by:    Discharge Disposition:  [place]  Per UR Regulation:    If discussed at Long Length of Stay Meetings, dates discussed:    Comments:  05/09/1510:09 CM met with pt to confirm need for DME.  Pt has both a roling walker and a 3n1 at home.  [facility] will render HHPT service.  No other CM needs were communicated. Mariane Masters, BSN, CM [phone]. l 09:00 CM met with pt to offer choice.  [facility]'s understanding was pt  has Christella Scheuermann (which is not in [employer]) but facesheet (and verified by pt) states he  has [employer] and would like [place] if at all possible.  CM spoke with [facility] rep, [place] who states she will accept and get insurance changed at [place] home office and [facility] will render HHPT.  Pt's wife, Butch Penny is checking out DME at home and CM will follow later today for needs. Mariane Masters, BSN, CM [phone].

## 2014-05-09 NOTE — Evaluation (Signed)
Occupational Therapy Evaluation Patient Details Name: Kevin Stafford MRN: [id number] DOB: November 21, 1945 Today's Date: 05/09/2014    History of Present Illness RDATHA   Clinical Impression   Pt up with OT to practice shower and toilet transfers. Issued shower transfer handout with education and reviewed technique several times with pt. All education completed and no further OT needs. Wife can assist PRN.    Follow Up Recommendations  No OT follow up    Equipment Recommendations  None recommended by OT    Recommendations for Other Services       Precautions / Restrictions Precautions Precautions: Fall Precaution Comments: suprapubic catheter Restrictions Weight Bearing Restrictions: No      Mobility Bed Mobility   Bed Mobility: Supine to Sit     Supine to sit: Min guard        Transfers Overall transfer level: Needs assistance Equipment used: Rolling walker (2 wheeled) Transfers: Sit to/from Stand Sit to Stand: Min guard         General transfer comment: verbal cues for hand placement.    Balance                                            ADL Overall ADL's : Needs assistance/impaired Eating/Feeding: Independent;Sitting   Grooming: Wash/dry hands;Set up;Sitting   Upper Body Bathing: Set up;Sitting   Lower Body Bathing: Minimal assistance;Sit to/from stand   Upper Body Dressing : Set up;Sitting   Lower Body Dressing: Moderate assistance;Sit to/from stand   Toilet Transfer: Min guard;Ambulation;BSC;RW   Toileting- Water quality scientist and Hygiene: Min guard;Sit to/from stand   Tub/ Shower Transfer: Patent attorney ADL Comments: educated pt on shower transfer technique with walker and practiced transfer. Issued handout with shower transfer education. Pt states wife can assist with LB self care until he is able and he is not currently interested in AE options. Min verbal cues for hand placement with functional  transfers. He has a built in seat but also discussed placement of 3in1 as shower chair. Discussed sequence for LB dressing and overall safety with ADL.     Vision                     Perception     Praxis      Pertinent Vitals/Pain Pain Assessment: 0-10 Pain Score: 8  Pain Location: R hip Pain Descriptors / Indicators: Aching Pain Intervention(s): Repositioned;Ice applied     Hand Dominance     Extremity/Trunk Assessment Upper Extremity Assessment Upper Extremity Assessment: Overall WFL for tasks assessed           Communication Communication Communication: No difficulties   Cognition Arousal/Alertness: Awake/alert Behavior During Therapy: WFL for tasks assessed/performed Overall Cognitive Status: Within Functional Limits for tasks assessed                     General Comments       Exercises       Shoulder Instructions      Home Living Family/patient expects to be discharged to:: Private residence Living Arrangements: Spouse/significant other;Children Available Help at Discharge: Family Type of Home: House Home Access: Stairs to enter Technical brewer of Steps: 2 Entrance Stairs-Rails: Left Home Layout: Able to live on main level with bedroom/bathroom     Bathroom Shower/Tub: Occupational psychologist: Standard  Home Equipment: [place] - 2 wheels;Bedside commode;Grab bars - tub/shower;Shower seat - built in          Prior Functioning/Environment Level of Independence: Independent             OT Diagnosis:     OT Problem List:     OT Treatment/Interventions:      OT Goals(Current goals can be found in the care plan section) Acute Rehab OT Goals Patient Stated Goal: return to independence.  OT Frequency:     Barriers to D/C:            Co-evaluation              End of Session Equipment Utilized During Treatment: Rolling walker  Activity Tolerance: Patient tolerated treatment well Patient  left: in chair;with call bell/phone within reach   Time: 1105-1125 OT Time Calculation (min): 20 min Charges:  OT General Charges $OT Visit: 1 Procedure OT Evaluation $Initial OT Evaluation Tier I: 1 Procedure OT Treatments $Therapeutic Activity: 8-22 mins G-Codes:    Jules Schick [phone] 05/09/2014, 11:53 AM

## 2014-05-09 NOTE — Progress Notes (Signed)
Physical Therapy Treatment Patient Details Name: Kevin Stafford MRN: [id number] DOB: 27-Apr-1946 Today's Date: 2014-05-19    History of Present Illness RDATHA    PT Comments    POD # 1 pm session.  Amb in hallway a great distance then practiced stairs.  Returened to room then applied ICE to R hip.  Follow Up Recommendations  Home health PT;Supervision/Assistance - 24 hour     Equipment Recommendations  None recommended by PT    Recommendations for Other Services       Precautions / Restrictions Precautions Precautions: Fall Precaution Comments: suprapubic catheter Restrictions Weight Bearing Restrictions: No    Mobility   Transfers Overall transfer level: Needs assistance Equipment used: Rolling walker (2 wheeled) Transfers: Sit to/from Stand Sit to Stand: Min guard         General transfer comment: 25% VC's on proper tech and hand placement  Ambulation/Gait Ambulation/Gait assistance: Min guard;Min assist Ambulation Distance (Feet): 225 Feet Assistive device: Rolling walker (2 wheeled) Gait Pattern/deviations: Step-to pattern;Step-through pattern;Trunk flexed Gait velocity: decreased   General Gait Details: <25% VC's on safety with turns and increased time.  Pt tends to took down 90% of time.   Stairs Stairs: Yes Stairs assistance: Min guard Stair Management: One rail Right;Forwards Number of Stairs: 4 General stair comments: 25% VC's on proper sequencing and safety.  Performed well  Wheelchair Mobility    Modified Rankin (Stroke Patients Only)       Balance                                    Cognition Arousal/Alertness: Awake/alert Behavior During Therapy: WFL for tasks assessed/performed Overall Cognitive Status: Within Functional Limits for tasks assessed                      Exercises      General Comments        Pertinent Vitals/Pain Pain Assessment: 0-10 Pain Score: 8  Pain Location: R hip Pain  Descriptors / Indicators: Aching Pain Intervention(s): Repositioned;Ice applied    Home Living Family/patient expects to be discharged to:: Private residence Living Arrangements: Spouse/significant other;Children Available Help at Discharge: Family Type of Home: House Home Access: Stairs to enter Entrance Stairs-Rails: Left Home Layout: Able to live on main level with bedroom/bathroom Home Equipment: Walker - 2 wheels;Bedside commode;Grab bars - tub/shower;Shower seat - built in      Prior Function Level of Independence: Independent          PT Goals (current goals can now be found in the care plan section) Acute Rehab PT Goals Patient Stated Goal: return to independence. Progress towards PT goals: Progressing toward goals    Frequency  7X/week    PT Plan      Co-evaluation             End of Session Equipment Utilized During Treatment: Gait belt Activity Tolerance: Patient tolerated treatment well Patient left: in chair;with call bell/phone within reach     Time: 1310-1324 PT Time Calculation (min): 14 min  Charges:  $Gait Training: 8-22 mins $Therapeutic Exercise: 8-22 mins                    G Codes:      Kevin Stafford 2014/05/19, 1:57 PM

## 2014-05-09 NOTE — Progress Notes (Signed)
   Subjective: 1 Day Post-Op Procedure(s) (LRB): RIGHT TOTAL HIP ARTHROPLASTY ANTERIOR APPROACH (Right) Patient reports pain as mild.   Patient seen in rounds for Dr. Wynelle Link. Patient is well, and has had no acute complaints or problems We resume therapy today.  He was able to get up and ambulate with PT yesterday. Plan is to go Home after hospital stay.  Objective: Vital signs in last 24 hours: Temp:  [97.5 F (36.4 C)-99 F (37.2 C)] 98.7 F (37.1 C) (09/10 0519) Pulse Rate:  [68-81] 71 (09/10 0519) Resp:  [15-20] 18 (09/10 0519) BP: (102-141)/(53-89) 105/64 mmHg (09/10 0519) SpO2:  [92 %-97 %] 96 % (09/10 0519)  Intake/Output from previous day:  Intake/Output Summary (Last 24 hours) at 05/09/14 0857 Last data filed at 05/09/14 0600  Gross per 24 hour  Intake 5083.75 ml  Output   2605 ml  Net 2478.75 ml    Intake/Output this shift: UOP 950 +2178  Labs:  Recent Labs  05/09/14 0428  HGB 10.9*    Recent Labs  05/09/14 0428  WBC 10.7*  RBC 3.92*  HCT 32.9*  PLT 204    Recent Labs  05/09/14 0428  NA 134*  K 4.4  CL 101  CO2 22  BUN 18  CREATININE 0.93  GLUCOSE 125*  CALCIUM 8.5   No results found for this basename: LABPT, INR,  in the last 72 hours  EXAM General - Patient is Alert, Appropriate and Oriented Extremity - Neurovascular intact Sensation intact distally Dressing - dressing C/D/I Motor Function - intact, moving foot and toes well on exam.  Hemovac pulled without difficulty.  Past Medical History  Diagnosis Date  . Nocturia   . Recurrent UTI   . Prostate cancer S/P RADIOACTIVE SEED IMPLANTS 2008 AND CRYOABLATION OF PROSTATE 06-222012  . Bladder retention of urine     foley since  09/27/11  . Osteomyelitis of pelvis   . Toxicity, radiation     "radiation in 2008-too much, bladder shrunk and prostate exploded"2012 s/s started so went to [facility]  . Hypertension   . History of kidney stones   . Arthritis     Assessment/Plan: 1 Day  Post-Op Procedure(s) (LRB): RIGHT TOTAL HIP ARTHROPLASTY ANTERIOR APPROACH (Right) Principal Problem:   OA (osteoarthritis) of hip  Estimated body mass index is 29.25 kg/(m^2) as calculated from the following:   Height as of this encounter: 6\' 3"  (1.905 m).   Weight as of this encounter: 106.142 kg (234 lb). Advance diet Up with therapy Plan for discharge tomorrow Discharge home with home health  DVT Prophylaxis - Xarelto Weight Bearing As Tolerated right Leg Hemovac Pulled Resume Therapy  Arlee Muslim, PA-C Orthopaedic Surgery 05/09/2014, 8:57 AM

## 2014-05-09 NOTE — Discharge Instructions (Addendum)
°Dr. Frank Aluisio °Total Joint Specialist °[facility] °[address] °[place], [place] [address] °[phone] ° ° ° °ANTERIOR APPROACH TOTAL HIP REPLACEMENT POSTOPERATIVE DIRECTIONS ° ° °Hip Rehabilitation, Guidelines Following Surgery  °The results of a hip operation are greatly improved after range of motion and muscle strengthening exercises. Follow all safety measures which are given to protect your hip. If any of these exercises cause increased pain or swelling in your joint, decrease the amount until you are comfortable again. Then slowly increase the exercises. Call your caregiver if you have problems or questions.  °HOME CARE INSTRUCTIONS  °Most of the following instructions are designed to prevent the dislocation of your new hip.  °Remove items at home which could result in a fall. This includes throw rugs or furniture in walking pathways.  °Continue medications as instructed at time of discharge. °· You may have some home medications which will be placed on hold until you complete the course of blood thinner medication. °· You may start showering once you are discharged home but do not submerge the incision under water. Just pat the incision dry and apply a dry gauze dressing on daily. °Do not put on socks or shoes without following the instructions of your caregivers.  °Sit on high chairs which makes it easier to stand.  °Sit on chairs with arms. Use the chair arms to help push yourself up when arising.  °Keep your leg on the side of the operation out in front of you when standing up.  °Arrange for the use of a toilet seat elevator so you are not sitting low.   °· Walk with walker as instructed.  °You may resume a sexual relationship in one month or when given the OK by your caregiver.  °Use walker as long as suggested by your caregivers.  °You may put full weight on your legs and walk as much as is comfortable. °Avoid periods of inactivity such as sitting longer than an hour  when not asleep. This helps prevent blood clots.  °You may return to work once you are cleared by your surgeon.  °Do not drive a car for 6 weeks or until released by your surgeon.  °Do not drive while taking narcotics.  °Wear elastic stockings for three weeks following surgery during the day but you may remove then at night.  °Make sure you keep all of your appointments after your operation with all of your doctors and caregivers. You should call the office at the above phone number and make an appointment for approximately two weeks after the date of your surgery. °Change the dressing daily and reapply a dry dressing each time. °Please pick up a stool softener and laxative for home use as long as you are requiring pain medications. °· Continue to use ice on the hip for pain and swelling from surgery. You may notice swelling that will progress down to the foot and ankle.  This is normal after  surgery.  Elevate the leg when you are not up walking on it.   °It is important for you to complete the blood thinner medication as prescribed by your doctor. °· Continue to use the breathing machine which will help keep your temperature down.  It is common for your temperature to cycle up and down following surgery, especially at night when you are not up moving around and exerting yourself.  The breathing machine keeps your lungs expanded and your temperature down. ° °RANGE OF MOTION AND STRENGTHENING EXERCISES  °  These exercises are designed to help you keep full movement of your hip joint. Follow your caregiver's or physical therapist's instructions. Perform all exercises about fifteen times, three times per day or as directed. Exercise both hips, even if you have had only one joint replacement. These exercises can be done on a training (exercise) mat, on the floor, on a table or on a bed. Use whatever works the best and is most comfortable for you. Use music or television while you are exercising so that the exercises are  a pleasant break in your day. This will make your life better with the exercises acting as a break in routine you can look forward to.  Lying on your back, slowly slide your foot toward your buttocks, raising your knee up off the floor. Then slowly slide your foot back down until your leg is straight again.  Lying on your back spread your legs as far apart as you can without causing discomfort.  Lying on your side, raise your upper leg and foot straight up from the floor as far as is comfortable. Slowly lower the leg and repeat.  Lying on your back, tighten up the muscle in the front of your thigh (quadriceps muscles). You can do this by keeping your leg straight and trying to raise your heel off the floor. This helps strengthen the largest muscle supporting your knee.  Lying on your back, tighten up the muscles of your buttocks both with the legs straight and with the knee bent at a comfortable angle while keeping your heel on the floor.   SKILLED REHAB INSTRUCTIONS: If the patient is transferred to a skilled rehab facility following release from the hospital, a list of the current medications will be sent to the facility for the patient to continue.  When discharged from the skilled rehab facility, please have the facility set up the patient's [place] prior to being released. Also, the skilled facility will be responsible for providing the patient with their medications at time of release from the facility to include their pain medication, the muscle relaxants, and their blood thinner medication. If the patient is still at the rehab facility at time of the two week follow up appointment, the skilled rehab facility will also need to assist the patient in arranging follow up appointment in our office and any transportation needs.  MAKE SURE YOU:  Understand these instructions.  Will watch your condition.  Will get help right away if you are not doing well or get worse.  Pick up  stool softner and laxative for home. Do not submerge incision under water. May shower. Continue to use ice for pain and swelling from surgery. Total Hip Protocol.  Take Xarelto for two and a half more weeks, then discontinue Xarelto. Once the patient has completed the blood thinner regimen, then take a Baby 81 mg Aspirin daily for three more weeks.  Information on my medicine - XARELTO (Rivaroxaban)  This medication education was reviewed with me or my healthcare representative as part of my discharge preparation.  The pharmacist that spoke with me during my hospital stay was:  Clovis Riley, [facility]  Why was Xarelto prescribed for you? Xarelto was prescribed for you to reduce the risk of blood clots forming after orthopedic surgery. The medical term for these abnormal blood clots is venous thromboembolism (VTE).  What do you need to know about xarelto ? Take your Xarelto ONCE DAILY at the same time every day. You may  take it either with or without food.  If you have difficulty swallowing the tablet whole, you may crush it and mix in applesauce just prior to taking your dose.  Take Xarelto exactly as prescribed by your doctor and DO NOT stop taking Xarelto without talking to the doctor who prescribed the medication.  Stopping without other VTE prevention medication to take the place of Xarelto may increase your risk of developing a clot.  After discharge, you should have regular check-up appointments with your healthcare provider that is prescribing your Xarelto.    What do you do if you miss a dose? If you miss a dose, take it as soon as you remember on the same day then continue your regularly scheduled once daily regimen the next day. Do not take two doses of Xarelto on the same day.   Important Safety Information A possible side effect of Xarelto is bleeding. You should call your healthcare provider right away if you experience any of the following:   Bleeding from  an injury or your nose that does not stop.   Unusual colored urine (red or dark brown) or unusual colored stools (red or black).   Unusual bruising for unknown reasons.   A serious fall or if you hit your head (even if there is no bleeding).  Some medicines may interact with Xarelto and might increase your risk of bleeding while on Xarelto. To help avoid this, consult your healthcare provider or pharmacist prior to using any new prescription or non-prescription medications, including herbals, vitamins, non-steroidal anti-inflammatory drugs (NSAIDs) and supplements.  This website has more information on Xarelto: [email or web address]

## 2014-05-10 LAB — CBC
HEMATOCRIT: 34 % — AB (ref 39.0–52.0)
HEMOGLOBIN: 10.7 g/dL — AB (ref 13.0–17.0)
MCH: 27 pg (ref 26.0–34.0)
MCHC: 31.5 g/dL (ref 30.0–36.0)
MCV: 85.6 fL (ref 78.0–100.0)
Platelets: 194 10*3/uL (ref 150–400)
RBC: 3.97 MIL/uL — AB (ref 4.22–5.81)
RDW: 15.7 % — ABNORMAL HIGH (ref 11.5–15.5)
WBC: 10.3 10*3/uL (ref 4.0–10.5)

## 2014-05-10 LAB — BASIC METABOLIC PANEL
Anion gap: 13 (ref 5–15)
BUN: 16 mg/dL (ref 6–23)
CO2: 24 meq/L (ref 19–32)
Calcium: 8.7 mg/dL (ref 8.4–10.5)
Chloride: 102 mEq/L (ref 96–112)
Creatinine, Ser: 0.85 mg/dL (ref 0.50–1.35)
GFR calc Af Amer: >90 mL/min (ref >90)
GFR calc non Af Amer: 89 mL/min — ABNORMAL LOW (ref >90)
Glucose, Bld: 120 mg/dL — ABNORMAL HIGH (ref 70–99)
POTASSIUM: 4.5 meq/L (ref 3.7–5.3)
SODIUM: 139 meq/L (ref 137–147)

## 2014-05-10 MED ORDER — METHOCARBAMOL 500 MG PO TABS: 500.0000 mg | ORAL_TABLET | Freq: Four times a day (QID) | ORAL | Status: DC | PRN

## 2014-05-10 MED ORDER — OXYCODONE HCL 5 MG PO TABS: 5.0000 mg | ORAL_TABLET | ORAL | Status: DC | PRN

## 2014-05-10 MED ORDER — RIVAROXABAN 10 MG PO TABS: 10.0000 mg | ORAL_TABLET | Freq: Every day | ORAL | Status: DC

## 2014-05-10 MED ORDER — TRAMADOL HCL 50 MG PO TABS: 50.0000 mg | ORAL_TABLET | Freq: Four times a day (QID) | ORAL | Status: DC | PRN

## 2014-05-10 NOTE — Discharge Summary (Signed)
Physician Discharge Summary   Patient ID: TONEY DIFATTA MRN: [id number] DOB/AGE: 66-01-1946 66 y.o.  Admit date: 05/08/2014 Discharge date: 05/10/2014  Primary Diagnosis:  Osteoarthritis of the Right hip.   Admission Diagnoses:  Past Medical History  Diagnosis Date  . Nocturia   . Recurrent UTI   . Prostate cancer S/P RADIOACTIVE SEED IMPLANTS 2008 AND CRYOABLATION OF PROSTATE 06-222012  . Bladder retention of urine     foley since  09/27/11  . Osteomyelitis of pelvis   . Toxicity, radiation     "radiation in 2008-too much, bladder shrunk and prostate exploded"2012 s/s started so went to [facility]  . Hypertension   . History of kidney stones   . Arthritis    Discharge Diagnoses:   Principal Problem:   OA (osteoarthritis) of hip  Estimated body mass index is 29.25 kg/(m^2) as calculated from the following:   Height as of this encounter: 6' 3" (1.905 m).   Weight as of this encounter: 106.142 kg (234 lb).  Procedure(s) (LRB): RIGHT TOTAL HIP ARTHROPLASTY ANTERIOR APPROACH (Right)   Consults: None  HPI: Kevin Stafford is a 68 y.o. male who has advanced end-  stage arthritis of his Right hip with progressively worsening pain and  dysfunction.The patient has failed nonoperative management and presents for  total hip arthroplasty.   Laboratory Data: Admission on 05/08/2014  Component Date Value Ref Range Status  . ABO/RH(D) 05/08/2014 A POS   Final  . Antibody Screen 05/08/2014 NEG   Final  . Sample Expiration 05/08/2014 05/11/2014   Final  . ABO/RH(D) 05/08/2014 A POS   Final  . WBC 05/09/2014 10.7* 4.0 - 10.5 K/uL Final  . RBC 05/09/2014 3.92* 4.22 - 5.81 MIL/uL Final  . Hemoglobin 05/09/2014 10.9* 13.0 - 17.0 g/dL Final  . HCT 05/09/2014 32.9* 39.0 - 52.0 % Final  . MCV 05/09/2014 83.9  78.0 - 100.0 fL Final  . MCH 05/09/2014 27.8  26.0 - 34.0 pg Final  . MCHC 05/09/2014 33.1  30.0 - 36.0 g/dL Final  . RDW 05/09/2014 15.5  11.5 - 15.5 % Final  . Platelets 05/09/2014 204   150 - 400 K/uL Final  . Sodium 05/09/2014 134* 137 - 147 mEq/L Final  . Potassium 05/09/2014 4.4  3.7 - 5.3 mEq/L Final  . Chloride 05/09/2014 101  96 - 112 mEq/L Final  . CO2 05/09/2014 22  19 - 32 mEq/L Final  . Glucose, Bld 05/09/2014 125* 70 - 99 mg/dL Final  . BUN 05/09/2014 18  6 - 23 mg/dL Final  . Creatinine, Ser 05/09/2014 0.93  0.50 - 1.35 mg/dL Final  . Calcium 05/09/2014 8.5  8.4 - 10.5 mg/dL Final  . GFR calc non Af Amer 05/09/2014 86* >90 mL/min Final  . GFR calc Af Amer 05/09/2014 >90  >90 mL/min Final   Comment: (NOTE)                          The eGFR has been calculated using the CKD EPI equation.                          This calculation has not been validated in all clinical situations.                          eGFR's persistently <90 mL/min signify possible Chronic Kidney  Disease.  . Anion gap 05/09/2014 11  5 - 15 Final  . WBC 05/10/2014 10.3  4.0 - 10.5 K/uL Final  . RBC 05/10/2014 3.97* 4.22 - 5.81 MIL/uL Final  . Hemoglobin 05/10/2014 10.7* 13.0 - 17.0 g/dL Final  . HCT 05/10/2014 34.0* 39.0 - 52.0 % Final  . MCV 05/10/2014 85.6  78.0 - 100.0 fL Final  . MCH 05/10/2014 27.0  26.0 - 34.0 pg Final  . MCHC 05/10/2014 31.5  30.0 - 36.0 g/dL Final  . RDW 05/10/2014 15.7* 11.5 - 15.5 % Final  . Platelets 05/10/2014 194  150 - 400 K/uL Final  . Sodium 05/10/2014 139  137 - 147 mEq/L Final  . Potassium 05/10/2014 4.5  3.7 - 5.3 mEq/L Final  . Chloride 05/10/2014 102  96 - 112 mEq/L Final  . CO2 05/10/2014 24  19 - 32 mEq/L Final  . Glucose, Bld 05/10/2014 120* 70 - 99 mg/dL Final  . BUN 05/10/2014 16  6 - 23 mg/dL Final  . Creatinine, Ser 05/10/2014 0.85  0.50 - 1.35 mg/dL Final  . Calcium 05/10/2014 8.7  8.4 - 10.5 mg/dL Final  . GFR calc non Af Amer 05/10/2014 89* >90 mL/min Final  . GFR calc Af Amer 05/10/2014 >90  >90 mL/min Final   Comment: (NOTE)                          The eGFR has been calculated using the CKD EPI equation.                           This calculation has not been validated in all clinical situations.                          eGFR's persistently <90 mL/min signify possible Chronic Kidney                          Disease.  Georgiann Hahn gap 05/10/2014 13  5 - 15 Final  Hospital Outpatient Visit on 04/29/2014  Component Date Value Ref Range Status  . aPTT 04/29/2014 27  24 - 37 seconds Final  . WBC 04/29/2014 8.2  4.0 - 10.5 K/uL Final  . RBC 04/29/2014 4.84  4.22 - 5.81 MIL/uL Final  . Hemoglobin 04/29/2014 12.9* 13.0 - 17.0 g/dL Final  . HCT 04/29/2014 40.9  39.0 - 52.0 % Final  . MCV 04/29/2014 84.5  78.0 - 100.0 fL Final  . MCH 04/29/2014 26.7  26.0 - 34.0 pg Final  . MCHC 04/29/2014 31.5  30.0 - 36.0 g/dL Final  . RDW 04/29/2014 15.6* 11.5 - 15.5 % Final  . Platelets 04/29/2014 262  150 - 400 K/uL Final  . Sodium 04/29/2014 145  137 - 147 mEq/L Final  . Potassium 04/29/2014 4.6  3.7 - 5.3 mEq/L Final  . Chloride 04/29/2014 105  96 - 112 mEq/L Final  . CO2 04/29/2014 23  19 - 32 mEq/L Final  . Glucose, Bld 04/29/2014 110* 70 - 99 mg/dL Final  . BUN 04/29/2014 29* 6 - 23 mg/dL Final  . Creatinine, Ser 04/29/2014 1.41* 0.50 - 1.35 mg/dL Final  . Calcium 04/29/2014 9.8  8.4 - 10.5 mg/dL Final  . Total Protein 04/29/2014 7.8  6.0 - 8.3 g/dL Final  . Albumin 04/29/2014 3.9  3.5 - 5.2 g/dL Final  . AST 04/29/2014  15  0 - 37 U/L Final  . ALT 04/29/2014 14  0 - 53 U/L Final  . Alkaline Phosphatase 04/29/2014 88  39 - 117 U/L Final  . Total Bilirubin 04/29/2014 <0.2* 0.3 - 1.2 mg/dL Final  . GFR calc non Af Amer 04/29/2014 50* >90 mL/min Final  . GFR calc Af Amer 04/29/2014 58* >90 mL/min Final   Comment: (NOTE)                          The eGFR has been calculated using the CKD EPI equation.                          This calculation has not been validated in all clinical situations.                          eGFR's persistently <90 mL/min signify possible Chronic Kidney                          Disease.    . Anion gap 04/29/2014 17* 5 - 15 Final  . Prothrombin Time 04/29/2014 13.7  11.6 - 15.2 seconds Final  . INR 04/29/2014 1.05  0.00 - 1.49 Final  . Color, Urine 04/29/2014 YELLOW  YELLOW Final  . APPearance 04/29/2014 TURBID* CLEAR Final  . Specific Gravity, Urine 04/29/2014 1.017  1.005 - 1.030 Final  . pH 04/29/2014 6.0  5.0 - 8.0 Final  . Glucose, UA 04/29/2014 NEGATIVE  NEGATIVE mg/dL Final  . Hgb urine dipstick 04/29/2014 NEGATIVE  NEGATIVE Final  . Bilirubin Urine 04/29/2014 NEGATIVE  NEGATIVE Final  . Ketones, ur 04/29/2014 NEGATIVE  NEGATIVE mg/dL Final  . Protein, ur 04/29/2014 NEGATIVE  NEGATIVE mg/dL Final  . Urobilinogen, UA 04/29/2014 0.2  0.0 - 1.0 mg/dL Final  . Nitrite 04/29/2014 POSITIVE* NEGATIVE Final  . Leukocytes, UA 04/29/2014 MODERATE* NEGATIVE Final  . MRSA, PCR 04/29/2014 NEGATIVE  NEGATIVE Final  . Staphylococcus aureus 04/29/2014 NEGATIVE  NEGATIVE Final   Comment:                                 The Xpert SA Assay (FDA                          approved for NASAL specimens                          in patients over 21 years of age),                          is one component of                          a comprehensive surveillance                          program.  Test performance has                          been validated by [facility] for patients greater                          than or equal to 3 year old.                          It is not intended                          to diagnose infection nor to                          guide or monitor treatment.  . Squamous Epithelial / LPF 04/29/2014 RARE  RARE Final  . WBC, UA 04/29/2014 21-50  <3 WBC/hpf Final  . Bacteria, UA 04/29/2014 MANY* RARE Final     X-Rays:Dg Hip Complete Right  04/29/2014   CLINICAL DATA:  Preop right total hip arthroplasty.  EXAM: RIGHT HIP - COMPLETE 2+ VIEW  COMPARISON:  01/04/2011  FINDINGS: Exam demonstrates severe degenerative changes of the  right hip with interval progression. There are moderate degenerative changes of the left hip with interval progression. Worsening focal sclerosis measuring 3.8 cm over the right iliac bone adjacent the sacroiliac joint with increased uptake on previous bone scan likely progression of metastatic disease in this patient with known prostate cancer. Slight increased sclerosis over the inferior aspect of the right iliac bone adjacent the sacroiliac joint. There are surgical clips over the pelvis. Remainder of the exam is unchanged.  IMPRESSION: Severe degenerative change of the right hip and moderate degenerative change of the left hip with interval progression.  Worsening focal sclerosis over the superior right iliac bone adjacent the sacroiliac joint likely progression of metastatic disease in this patient with known prostate cancer. Slight worsening sclerosis over the inferior aspect of the right iliac bone adjacent sacroiliac joint.   Electronically Signed   By: Marin Olp M.D.   On: 04/29/2014 15:18   Dg Pelvis Portable  05/08/2014   CLINICAL DATA:  Postop hip replacement; history prostate cancer  EXAM: DG C-ARM 1-60 MIN - NRPT [facility]; PORTABLE PELVIS 1-2 VIEWS  COMPARISON:  Portable exam 1047 hr compared to 01/04/2011 and 04/29/2014  Correlation: CT pelvis 03/21/2013, radionuclide bone scan 12/24/2013  FINDINGS: New acetabular and femoral components of a RIGHT hip prosthesis.  No acute fracture dislocation.  Bones appear demineralized.  Advanced osteoarthritic changes LEFT hip.  Surgical drain and expected postsurgical soft tissue changes at the RIGHT hip region.  Sclerotic foci RIGHT ilium likely representing sclerotic metastases in this patient with history of prostate cancer, both of these sites demonstrating increased tracer localization on bone scan.  IMPRESSION: RIGHT hip prosthesis without acute complication.  Sclerotic metastases RIGHT ilium.  Advanced osteoarthritic changes LEFT hip joint.    Electronically Signed   By: Lavonia Dana M.D.   On: 05/08/2014 11:41   Dg C-arm 1-60 Min-no Report  05/08/2014   CLINICAL DATA:  Postop hip replacement; history prostate cancer  EXAM: DG C-ARM 1-60 MIN - NRPT [facility]; PORTABLE PELVIS 1-2 VIEWS  COMPARISON:  Portable exam 1047 hr compared to 01/04/2011 and 04/29/2014  Correlation: CT pelvis 03/21/2013, radionuclide bone scan 12/24/2013  FINDINGS: New acetabular and femoral components of a RIGHT hip prosthesis.  No acute fracture dislocation.  Bones appear demineralized.  Advanced osteoarthritic changes LEFT hip.  Surgical drain and expected postsurgical soft tissue changes  at the RIGHT hip region.  Sclerotic foci RIGHT ilium likely representing sclerotic metastases in this patient with history of prostate cancer, both of these sites demonstrating increased tracer localization on bone scan.  IMPRESSION: RIGHT hip prosthesis without acute complication.  Sclerotic metastases RIGHT ilium.  Advanced osteoarthritic changes LEFT hip joint.   Electronically Signed   By: Mark  Boles M.D.   On: 05/08/2014 11:41    EKG: Orders placed during the hospital encounter of 04/29/14  . EKG 12-LEAD  . EKG 12-LEAD     Hospital Course: Patient was admitted to [facility] and taken to the OR and underwent the above state procedure without complications.  Patient tolerated the procedure well and was later transferred to the recovery room and then to the orthopaedic floor for postoperative care.  They were given PO and IV analgesics for pain control following their surgery.  They were given 24 hours of postoperative antibiotics of  Anti-infectives   Start     Dose/Rate Route Frequency Ordered Stop   05/08/14 1400  ceFAZolin (ANCEF) IVPB 2 g/50 mL premix     2 g 100 mL/hr over 30 Minutes Intravenous Every 6 hours 05/08/14 1205 05/08/14 2235   05/08/14 0715  ceFAZolin (ANCEF) IVPB 2 g/50 mL premix     2 g 100 mL/hr over 30 Minutes Intravenous On call to O.R. 05/08/14  0715 05/08/14 0845     and started on DVT prophylaxis in the form of Xarelto.   PT and OT were ordered for total hip protocol.  The patient was allowed to be WBAT with therapy. Discharge planning was consulted to help with postop disposition and equipment needs.  Patient had a good night on the evening of surgery.  They started to get up OOB with therapy on the day of surgery and then again on day one.  Hemovac drain was pulled without difficulty on day one.  Continued to work with therapy into day two.  Dressing was changed on day two and the incision was healing well.  Patient was seen in rounds and was ready to go home.  Discharge home with home health  Diet - Cardiac diet  Follow up - in 2 weeks on 05/21/2014  Activity - WBAT  Disposition - Home  Condition Upon Discharge - Good  D/C Meds - See DC Summary  DVT Prophylaxis - Xarelto   Discharge Instructions   Call MD / Call 911    Complete by:  As directed   If you experience chest pain or shortness of breath, CALL 911 and be transported to the hospital emergency room.  If you develope a fever above 101 F, pus (white drainage) or increased drainage or redness at the wound, or calf pain, call your surgeon's office.     Change dressing    Complete by:  As directed   You may change your dressing dressing daily with sterile 4 x 4 inch gauze dressing and paper tape.  Do not submerge the incision under water.     Constipation Prevention    Complete by:  As directed   Drink plenty of fluids.  Prune juice may be helpful.  You may use a stool softener, such as Colace (over the counter) 100 mg twice a day.  Use MiraLax (over the counter) for constipation as needed.     Diet - low sodium heart healthy    Complete by:  As directed      Discharge instructions    Complete   by:  As directed   Pick up stool softner and laxative for home. Do not submerge incision under water. May shower. Continue to use ice for pain and swelling from surgery.  Total  Hip Protocol.  Take Xarelto for two and a half more weeks, then discontinue Xarelto. Once the patient has completed the blood thinner regimen, then take a Baby 81 mg Aspirin daily for three more weeks.     Do not sit on low chairs, stoools or toilet seats, as it may be difficult to get up from low surfaces    Complete by:  As directed      Driving restrictions    Complete by:  As directed   No driving until released by the physician.     Increase activity slowly as tolerated    Complete by:  As directed      Lifting restrictions    Complete by:  As directed   No lifting until released by the physician.     Patient may shower    Complete by:  As directed   You may shower without a dressing once there is no drainage.  Do not wash over the wound.  If drainage remains, do not shower until drainage stops.     TED hose    Complete by:  As directed   Use stockings (TED hose) for 3 weeks on both leg(s).  You may remove them at night for sleeping.     Weight bearing as tolerated    Complete by:  As directed             Medication List    STOP taking these medications       B-complex with vitamin C tablet     CALCIUM + D PO     ibuprofen 200 MG tablet  Commonly known as:  ADVIL,MOTRIN     multivitamin with minerals Tabs tablet     OSTEO BI-FLEX ADV DOUBLE ST PO      TAKE these medications       lisinopril 10 MG tablet  Commonly known as:  PRINIVIL,ZESTRIL  Take 10 mg by mouth every morning.     methocarbamol 500 MG tablet  Commonly known as:  ROBAXIN  Take 1 tablet (500 mg total) by mouth every 6 (six) hours as needed for muscle spasms.     oxyCODONE 5 MG immediate release tablet  Commonly known as:  Oxy IR/ROXICODONE  Take 1-3 tablets (5-15 mg total) by mouth every 3 (three) hours as needed for moderate pain, severe pain or breakthrough pain.     rivaroxaban 10 MG Tabs tablet  Commonly known as:  XARELTO  - Take 1 tablet (10 mg total) by mouth daily with breakfast.  Take Xarelto for two and a half more weeks, then discontinue Xarelto.  - Once the patient has completed the blood thinner regimen, then take a Baby 81 mg Aspirin daily for three more weeks.     traMADol 50 MG tablet  Commonly known as:  ULTRAM  Take 1-2 tablets (50-100 mg total) by mouth every 6 (six) hours as needed (mild pain).           Follow-up Information   Follow up with ALUISIO,FRANK V, MD. Schedule an appointment as soon as possible for a visit on 05/21/2014. (Call office at [phone] to set up appointment on Tuesday 05/21/2014)    Specialty:  Orthopedic Surgery   Contact information:   [address] [place]   [address] [phone]       Signed: Drew Perkins, PA-C Orthopaedic Surgery 05/10/2014, 8:28 AM    

## 2014-05-10 NOTE — Progress Notes (Signed)
Physical Therapy Treatment Patient Details Name: Kevin Stafford MRN: [id number] DOB: 23-Feb-1946 Today's Date: 05/10/2014    History of Present Illness RDATHA    PT Comments    POD # 2 pt plans to D/C to home today.  Assisted OOB to amb a great distance in hallway.  Practiced stairs then returned to room and performed all THR TE's following handout HEP.  Assisted back to bed and applied ICE.  Follow Up Recommendations        Equipment Recommendations       Recommendations for Other Services       Precautions / Restrictions Precautions Precautions: Fall Precaution Comments: suprapubic catheter Restrictions Weight Bearing Restrictions: No    Mobility  Bed Mobility Overal bed mobility: Modified Independent                Transfers Overall transfer level: Modified independent Equipment used: Standard walker             General transfer comment: increased time  Ambulation/Gait Ambulation/Gait assistance: Modified independent (Device/Increase time);Supervision Ambulation Distance (Feet): 500 Feet Assistive device: Rolling walker (2 wheeled) Gait Pattern/deviations: Step-through pattern;Trunk flexed (looks down) Gait velocity: WFL       Stairs Stairs: Yes Stairs assistance: Supervision Stair Management: One rail Right;Step to pattern;Forwards Number of Stairs: 4 General stair comments: one initial VC on proper sequencing  Wheelchair Mobility    Modified Rankin (Stroke Patients Only)       Balance                                    Cognition                            Exercises   Total Hip Replacement TE's supine 10 reps ankle pumps 10 reps knee presses 10 reps heel slides 10 reps SAQ's 10 reps ABD Standing 10 reps hip flex 10 reps hip ABd 10 reps hip ext 10 reps knee flex Followed by ICE    General Comments        Pertinent Vitals/Pain      Home Living                      Prior Function             PT Goals (current goals can now be found in the care plan section)      Frequency       PT Plan      Co-evaluation             End of Session           Time: 4665-9935 PT Time Calculation (min): 25 min  Charges:  $Gait Training: 8-22 mins $Therapeutic Exercise: 8-22 mins                    G Codes:      Rica Koyanagi  PTA [facility]  Acute  Rehab Pager      [phone]

## 2014-05-10 NOTE — Progress Notes (Signed)
   Subjective: 2 Days Post-Op Procedure(s) (LRB): RIGHT TOTAL HIP ARTHROPLASTY ANTERIOR APPROACH (Right) Patient reports pain as mild.   Patient seen in rounds with Dr. Wynelle Link. Patient is well, and has had no acute complaints or problems Patient is ready to go home  Objective: Vital signs in last 24 hours: Temp:  [98.1 F (36.7 C)-99.1 F (37.3 C)] 98.9 F (37.2 C) (09/11 0510) Pulse Rate:  [72-94] 72 (09/11 0510) Resp:  [16-17] 16 (09/11 0510) BP: (118-145)/(70-94) 127/70 mmHg (09/11 0510) SpO2:  [93 %-95 %] 95 % (09/11 0510)  Intake/Output from previous day:  Intake/Output Summary (Last 24 hours) at 05/10/14 0821 Last data filed at 05/10/14 0510  Gross per 24 hour  Intake    960 ml  Output   3900 ml  Net  -2940 ml    Intake/Output this shift:    Labs:  Recent Labs  05/09/14 0428 05/10/14 0435  HGB 10.9* 10.7*    Recent Labs  05/09/14 0428 05/10/14 0435  WBC 10.7* 10.3  RBC 3.92* 3.97*  HCT 32.9* 34.0*  PLT 204 194    Recent Labs  05/09/14 0428 05/10/14 0435  NA 134* 139  K 4.4 4.5  CL 101 102  CO2 22 24  BUN 18 16  CREATININE 0.93 0.85  GLUCOSE 125* 120*  CALCIUM 8.5 8.7   No results found for this basename: LABPT, INR,  in the last 72 hours  EXAM: General - Patient is Alert, Appropriate and Oriented Extremity - Neurovascular intact Sensation intact distally Dorsiflexion/Plantar flexion intact Incision - clean, dry, no drainage, healing Motor Function - intact, moving foot and toes well on exam.   Assessment/Plan: 2 Days Post-Op Procedure(s) (LRB): RIGHT TOTAL HIP ARTHROPLASTY ANTERIOR APPROACH (Right) Procedure(s) (LRB): RIGHT TOTAL HIP ARTHROPLASTY ANTERIOR APPROACH (Right) Past Medical History  Diagnosis Date  . Nocturia   . Recurrent UTI   . Prostate cancer S/P RADIOACTIVE SEED IMPLANTS 2008 AND CRYOABLATION OF PROSTATE 06-222012  . Bladder retention of urine     foley since  09/27/11  . Osteomyelitis of pelvis   . Toxicity,  radiation     "radiation in 2008-too much, bladder shrunk and prostate exploded"2012 s/s started so went to [facility]  . Hypertension   . History of kidney stones   . Arthritis    Principal Problem:   OA (osteoarthritis) of hip  Estimated body mass index is 29.25 kg/(m^2) as calculated from the following:   Height as of this encounter: 6\' 3"  (1.905 m).   Weight as of this encounter: 106.142 kg (234 lb). Up with therapy Discharge home with home health Diet - Cardiac diet Follow up - in 2 weeks on 05/21/2014 Activity - WBAT Disposition - Home Condition Upon Discharge - Good D/C Meds - See DC Summary DVT Prophylaxis - Xarelto  Arlee Muslim, PA-C Orthopaedic Surgery 05/10/2014, 8:21 AM

## 2014-09-10 ENCOUNTER — Ambulatory Visit: Payer: Self-pay | Admitting: Orthopedic Surgery

## 2014-09-10 NOTE — Progress Notes (Signed)
Preoperative surgical orders have been place into the Epic hospital system for Kevin Stafford on 09/10/2014, 5:57 PM  by Mickel Crow for surgery on 09-25-2014.  Preop Total Hip - Anterior Approach orders including Experel Injecion, IV Tylenol, and IV Decadron as long as there are no contraindications to the above medications. Arlee Muslim, PA-C

## 2014-09-17 NOTE — Patient Instructions (Signed)
Kevin Stafford  09/17/2014   Your procedure is scheduled on: 09/25/2014    Report to [facility] Main  Entrance and follow signs to               [place] at     1000 AM.  Call this number if you have problems the morning of surgery [phone]   Remember:  Do not eat food or drink liquids :After Midnight.     Take these medicines the morning of surgery with A SIP OF WATER:                                You may not have any metal on your body including hair pins and              piercings  Do not wear jewelry,  lotions, powders or perfumes.                          Men may shave face and neck.   Do not bring valuables to the hospital. [place].  Contacts, dentures or bridgework may not be worn into surgery.  Leave suitcase in the car. After surgery it may be brought to your room.       Special Instructions: coughing and deep breathing exercises,leg exercises              Please read over the following fact sheets you were given: _____________________________________________________________________             [facility] - Preparing for Surgery Before surgery, you can play an important role.  Because skin is not sterile, your skin needs to be as free of germs as possible.  You can reduce the number of germs on your skin by washing with CHG (chlorahexidine gluconate) soap before surgery.  CHG is an antiseptic cleaner which kills germs and bonds with the skin to continue killing germs even after washing. Please DO NOT use if you have an allergy to CHG or antibacterial soaps.  If your skin becomes reddened/irritated stop using the CHG and inform your nurse when you arrive at Short Stay. Do not shave (including legs and underarms) for at least 48 hours prior to the first CHG shower.  You may shave your face/neck. Please follow these instructions carefully:  1.  Shower with CHG Soap the night before  surgery and the  morning of Surgery.  2.  If you choose to wash your hair, wash your hair first as usual with your  normal  shampoo.  3.  After you shampoo, rinse your hair and body thoroughly to remove the  shampoo.                           4.  Use CHG as you would any other liquid soap.  You can apply chg directly  to the skin and wash                       Gently with a scrungie or clean washcloth.  5.  Apply the CHG Soap to your body ONLY FROM THE NECK DOWN.   Do not  use on face/ open                           Wound or open sores. Avoid contact with eyes, ears mouth and genitals (private parts).                       Wash face,  Genitals (private parts) with your normal soap.             6.  Wash thoroughly, paying special attention to the area where your surgery  will be performed.  7.  Thoroughly rinse your body with warm water from the neck down.  8.  DO NOT shower/wash with your normal soap after using and rinsing off  the CHG Soap.                9.  Pat yourself dry with a clean towel.            10.  Wear clean pajamas.            11.  Place clean sheets on your bed the night of your first shower and do not  sleep with pets. Day of Surgery : Do not apply any lotions/deodorants the morning of surgery.  Please wear clean clothes to the hospital/surgery center.  FAILURE TO FOLLOW THESE INSTRUCTIONS MAY RESULT IN THE CANCELLATION OF YOUR SURGERY PATIENT SIGNATURE_________________________________  NURSE SIGNATURE__________________________________  ________________________________________________________________________  WHAT IS A BLOOD TRANSFUSION? Blood Transfusion Information  A transfusion is the replacement of blood or some of its parts. Blood is made up of multiple cells which provide different functions.  Red blood cells carry oxygen and are used for blood loss replacement.  White blood cells fight against infection.  Platelets control bleeding.  Plasma helps clot  blood.  Other blood products are available for specialized needs, such as hemophilia or other clotting disorders. BEFORE THE TRANSFUSION  Who gives blood for transfusions?   Healthy volunteers who are fully evaluated to make sure their blood is safe. This is blood bank blood. Transfusion therapy is the safest it has ever been in the practice of medicine. Before blood is taken from a donor, a complete history is taken to make sure that person has no history of diseases nor engages in risky social behavior (examples are intravenous drug use or sexual activity with multiple partners). The donor's travel history is screened to minimize risk of transmitting infections, such as malaria. The donated blood is tested for signs of infectious diseases, such as HIV and hepatitis. The blood is then tested to be sure it is compatible with you in order to minimize the chance of a transfusion reaction. If you or a relative donates blood, this is often done in anticipation of surgery and is not appropriate for emergency situations. It takes many days to process the donated blood. RISKS AND COMPLICATIONS Although transfusion therapy is very safe and saves many lives, the main dangers of transfusion include:  1. Getting an infectious disease. 2. Developing a transfusion reaction. This is an allergic reaction to something in the blood you were given. Every precaution is taken to prevent this. The decision to have a blood transfusion has been considered carefully by your caregiver before blood is given. Blood is not given unless the benefits outweigh the risks. AFTER THE TRANSFUSION  Right after receiving a blood transfusion, you will usually feel much better and more energetic. This is especially true if  your red blood cells have gotten low (anemic). The transfusion raises the level of the red blood cells which carry oxygen, and this usually causes an energy increase.  The nurse administering the transfusion will  monitor you carefully for complications. HOME CARE INSTRUCTIONS  No special instructions are needed after a transfusion. You may find your energy is better. Speak with your caregiver about any limitations on activity for underlying diseases you may have. SEEK MEDICAL CARE IF:   Your condition is not improving after your transfusion.  You develop redness or irritation at the intravenous (IV) site. SEEK IMMEDIATE MEDICAL CARE IF:  Any of the following symptoms occur over the next 12 hours:  Shaking chills.  You have a temperature by mouth above 102 F (38.9 C), not controlled by medicine.  Chest, back, or muscle pain.  People around you feel you are not acting correctly or are confused.  Shortness of breath or difficulty breathing.  Dizziness and fainting.  You get a rash or develop hives.  You have a decrease in urine output.  Your urine turns a dark color or changes to pink, red, or brown. Any of the following symptoms occur over the next 10 days:  You have a temperature by mouth above 102 F (38.9 C), not controlled by medicine.  Shortness of breath.  Weakness after normal activity.  The white part of the eye turns yellow (jaundice).  You have a decrease in the amount of urine or are urinating less often.  Your urine turns a dark color or changes to pink, red, or brown. Document Released: 08/13/2000 Document Revised: 11/08/2011 Document Reviewed: 04/01/2008 ExitCare Patient Information 2014 [place].  _______________________________________________________________________  Incentive Spirometer  An incentive spirometer is a tool that can help keep your lungs clear and active. This tool measures how well you are filling your lungs with each breath. Taking long deep breaths may help reverse or decrease the chance of developing breathing (pulmonary) problems (especially infection) following:  A long period of time when you are unable to move or be  active. BEFORE THE PROCEDURE   If the spirometer includes an indicator to show your best effort, your nurse or respiratory therapist will set it to a desired goal.  If possible, sit up straight or lean slightly forward. Try not to slouch.  Hold the incentive spirometer in an upright position. INSTRUCTIONS FOR USE  3. Sit on the edge of your bed if possible, or sit up as far as you can in bed or on a chair. 4. Hold the incentive spirometer in an upright position. 5. Breathe out normally. 6. Place the mouthpiece in your mouth and seal your lips tightly around it. 7. Breathe in slowly and as deeply as possible, raising the piston or the ball toward the top of the column. 8. Hold your breath for 3-5 seconds or for as long as possible. Allow the piston or ball to fall to the bottom of the column. 9. Remove the mouthpiece from your mouth and breathe out normally. 10. Rest for a few seconds and repeat Steps 1 through 7 at least 10 times every 1-2 hours when you are awake. Take your time and take a few normal breaths between deep breaths. 11. The spirometer may include an indicator to show your best effort. Use the indicator as a goal to work toward during each repetition. 12. After each set of 10 deep breaths, practice coughing to be sure your lungs are clear. If you have an incision (  the cut made at the time of surgery), support your incision when coughing by placing a pillow or rolled up towels firmly against it. Once you are able to get out of bed, walk around indoors and cough well. You may stop using the incentive spirometer when instructed by your caregiver.  RISKS AND COMPLICATIONS  Take your time so you do not get dizzy or light-headed.  If you are in pain, you may need to take or ask for pain medication before doing incentive spirometry. It is harder to take a deep breath if you are having pain. AFTER USE  Rest and breathe slowly and easily.  It can be helpful to keep track of a log of  your progress. Your caregiver can provide you with a simple table to help with this. If you are using the spirometer at home, follow these instructions: [place] IF:   You are having difficultly using the spirometer.  You have trouble using the spirometer as often as instructed.  Your pain medication is not giving enough relief while using the spirometer.  You develop fever of 100.5 F (38.1 C) or higher. SEEK IMMEDIATE MEDICAL CARE IF:   You cough up bloody sputum that had not been present before.  You develop fever of 102 F (38.9 C) or greater.  You develop worsening pain at or near the incision site. MAKE SURE YOU:   Understand these instructions.  Will watch your condition.  Will get help right away if you are not doing well or get worse. Document Released: 12/27/2006 Document Revised: 11/08/2011 Document Reviewed: 02/27/2007 [facility] Patient Information 2014 [place], [place].   ________________________________________________________________________

## 2014-09-18 ENCOUNTER — Encounter (HOSPITAL_COMMUNITY)
Admission: RE | Admit: 2014-09-18 | Discharge: 2014-09-18 | Disposition: A | Discharge status: Home / Self Care | Payer: Medicare Other | Source: Ambulatory Visit | Attending: Orthopedic Surgery | Admitting: Orthopedic Surgery

## 2014-09-18 ENCOUNTER — Encounter (HOSPITAL_COMMUNITY): Payer: Self-pay

## 2014-09-18 DIAGNOSIS — Z01818 Encounter for other preprocedural examination: Secondary | ICD-10-CM | POA: Diagnosis not present

## 2014-09-18 DIAGNOSIS — M169 Osteoarthritis of hip, unspecified: Secondary | ICD-10-CM | POA: Insufficient documentation

## 2014-09-18 HISTORY — DX: Other specified postprocedural states: Z98.890

## 2014-09-18 LAB — URINE MICROSCOPIC-ADD ON

## 2014-09-18 LAB — URINALYSIS, ROUTINE W REFLEX MICROSCOPIC
BILIRUBIN URINE: NEGATIVE
Glucose, UA: NEGATIVE mg/dL
Hgb urine dipstick: NEGATIVE
Ketones, ur: NEGATIVE mg/dL
Nitrite: NEGATIVE
PH: 7 (ref 5.0–8.0)
Protein, ur: NEGATIVE mg/dL
SPECIFIC GRAVITY, URINE: 1.021 (ref 1.005–1.030)
Urobilinogen, UA: 0.2 mg/dL (ref 0.0–1.0)

## 2014-09-18 LAB — COMPREHENSIVE METABOLIC PANEL
ALK PHOS: 69 U/L (ref 39–117)
ALT: 17 U/L (ref 0–53)
AST: 20 U/L (ref 0–37)
Albumin: 4.3 g/dL (ref 3.5–5.2)
Anion gap: 8 (ref 5–15)
BUN: 28 mg/dL — ABNORMAL HIGH (ref 6–23)
CO2: 28 mmol/L (ref 19–32)
Calcium: 9.3 mg/dL (ref 8.4–10.5)
Chloride: 104 mEq/L (ref 96–112)
Creatinine, Ser: 1.11 mg/dL (ref 0.50–1.35)
GFR, EST AFRICAN AMERICAN: 78 mL/min — AB (ref >90)
GFR, EST NON AFRICAN AMERICAN: 67 mL/min — AB (ref >90)
GLUCOSE: 93 mg/dL (ref 70–99)
Potassium: 4.3 mmol/L (ref 3.5–5.1)
Sodium: 140 mmol/L (ref 135–145)
Total Bilirubin: 0.4 mg/dL (ref 0.3–1.2)
Total Protein: 7.1 g/dL (ref 6.0–8.3)

## 2014-09-18 LAB — APTT: aPTT: 25 seconds (ref 24–37)

## 2014-09-18 LAB — PROTIME-INR
INR: 0.99 (ref 0.00–1.49)
Prothrombin Time: 13.2 seconds (ref 11.6–15.2)

## 2014-09-18 LAB — SURGICAL PCR SCREEN
MRSA, PCR: NEGATIVE
Staphylococcus aureus: NEGATIVE

## 2014-09-18 NOTE — Progress Notes (Signed)
FYI  Patient has urostomy

## 2014-09-18 NOTE — Progress Notes (Addendum)
CT of abdomen/pelvis done 08/09/2014 in [place] in [facility] discusses lungs.   CBC/Diff  Done 09/05/2014 in [facility].

## 2014-09-19 ENCOUNTER — Encounter (HOSPITAL_COMMUNITY): Payer: Self-pay

## 2014-09-19 NOTE — Progress Notes (Signed)
EKG- 04/29/14 [facility]  EKG- 07/09/14 on chart  Clearance- Dr Redmond Pulling- 07/09/14 on chart

## 2014-09-24 ENCOUNTER — Ambulatory Visit: Payer: Self-pay | Admitting: Orthopedic Surgery

## 2014-09-24 NOTE — Progress Notes (Signed)
Spoke with Arlee Muslim [facility] for Dr Wynelle Link regarding patient 's medical history .  Arlee Muslim , [facility] stated patient needs regular TYPE and SCREEN.

## 2014-09-24 NOTE — H&P (Signed)
Kevin Stafford DOB: 07/01/1946 Married / Language: English / Race: White Male Date of Admission:  09/25/2014 CC:  Left Hip Pain History of Present Illness (Jumar Greenstreet L. Merlon Alcorta III PA-C; 09/02/2014 5:18 PM) The patient is a 69 year old male who comes in for a preoperative History and Physical. The patient is scheduled for a left total hip arthroplasty (anterior approach) to be performed by Dr. Dione Plover. Aluisio, MD at [facility] on 09-25-2014. The patient comes in out from right total hip arthroplasty (anterior approach). The patient states that he is doing very well at this time. The pain is under excellent control at this time and describes their pain as mild. The patient is currently doing home exercise program. The patient feels that they are progressing well at this time. The left hip is now the limiting factor with his mobility. He has done well with the right hip and is looking forward to getting the left hip done. They have been treated conservatively in the past for the above stated problem and despite conservative measures, they continue to have progressive pain and severe functional limitations and dysfunction. They have failed non-operative management including home exercise, medications, and injections. It is felt that they would benefit from undergoing total joint replacement. Risks and benefits of the procedure have been discussed with the patient and they elect to proceed with surgery. There are no active contraindications to surgery such as ongoing infection or rapidly progressive neurological disease.   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) Prostate Disease Kidney Stone Prostate Cancer  Allergies  No Known Drug Allergies  Family History (Zaven Klemens Monika Salk, III PA-C; 09/02/2014 5:05 PM) Diabetes Mellitus father and grandmother mothers  side Heart Disease grandfather mothers side Cancer mother, grandmother mothers side, grandmother fathers side and grandfather fathers side Hypertension father Osteoarthritis mother, grandmother mothers side and grandfather mothers side Osteoporosis mother and grandmother mothers side  Social History (Denetta Fei Monika Salk, III PA-C; 09/02/2014 5:05 PM) Tobacco use never smoker Tobacco / smoke exposure no Number of flights of stairs before winded greater than 5 Pain Contract no Previously in rehab no 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 Multivitamin (Oral) Active. Blood Pressure Pill Active.  Past Surgical History (Kiefer Opheim L Azya Barbero, III PA-C; 09/02/2014 5:07 PM) Colon Polyp Removal - Colonoscopy Foot Surgery bilateral Other Surgery Supra Pubic catheter, prostate ablation, mo's procedure r arm Prostatectomy; Transurethral Urostomy Procedure Located right abdomen   Review of Systems (Kasandra Fehr L. Rolin Schult III PA-C; 09/02/2014 5:08 PM) General Not Present- Chills, Fatigue, Fever, Memory Loss, Night Sweats, Weight Gain and Weight Loss. Skin Not Present- Eczema, Hives, Itching, Lesions and Rash. HEENT Not Present- Dentures, Double Vision, Headache, Hearing Loss, Tinnitus and Visual Loss. Respiratory Not Present- Allergies, Chronic Cough, Coughing up blood, Shortness of breath at rest and Shortness of breath with exertion. Cardiovascular Not Present- Chest Pain, Difficulty Breathing Lying Down, Murmur, Palpitations, Racing/skipping heartbeats  and Swelling. Gastrointestinal Not Present- Abdominal Pain, Bloody Stool, Constipation, Diarrhea, Difficulty Swallowing, Heartburn, Jaundice, Loss of appetitie, Nausea and Vomiting. Male Genitourinary Not  Present- Blood in Urine, Discharge, Flank Pain, Incontinence, Painful Urination, Urgency, Urinary frequency, Urinary Retention, Urinating at Night and Weak urinary stream. Musculoskeletal Present- Joint Pain. Not Present- Back Pain, Joint Swelling, Morning Stiffness, Muscle Pain, Muscle Weakness and Spasms. Neurological Not Present- Blackout spells, Difficulty with balance, Dizziness, Paralysis, Tremor and Weakness. Psychiatric Not Present- Insomnia.   Vitals  Weight: 228 lb Height: 74in Weight was reported by patient. Height was reported by patient. Body Surface Area: 2.3 m Body Mass Index: 29.27 kg/m  Pulse: 84 (Regular)  BP: 134/84 (Sitting, Right Arm, Standard)   Physical Exam (Marja Adderley L. Jamason Peckham III PA-C; 09/02/2014 5:07 PM) The physical exam findings are as follows: Note:Patient is a 69 year old male with continued hip pain.  General Mental Status -Alert, cooperative and good historian. General Appearance-pleasant, Not in acute distress. Orientation-Oriented X3. Build & Nutrition-Well nourished and Well developed. Note: Tall Framed  Head and Neck Head-normocephalic, atraumatic . Neck Global Assessment - supple, no bruit auscultated on the right, no bruit auscultated on the left.  Eye Pupil - Bilateral-Regular and Round. Motion - Bilateral-EOMI.  Chest and Lung Exam Auscultation Breath sounds - clear at anterior chest wall and clear at posterior chest wall. Adventitious sounds - No Adventitious sounds.  Cardiovascular Auscultation Rhythm - Regular rate and rhythm. Heart Sounds - S1 WNL and S2 WNL. Murmurs & Other Heart Sounds - Auscultation of the heart reveals - No Murmurs.  Abdomen Inspection Inspection of the abdomen reveals - Note:  Urostomy bag right abdomen. Palpation/Percussion Tenderness - Abdomen is non-tender to palpation. Rigidity (guarding) - Abdomen is soft. Auscultation Auscultation of the abdomen reveals - Bowel sounds normal.  Male Genitourinary Note: Not done, not pertinent to present illness   Musculoskeletal Note: On exam, well developed male in no distress. Jamerson is walking without a limp with no assist device. I can flex his right hip to 120, rotate in 30, out 40 and abduct 40 without discomfort. Left hip flexion to 90. No internal or external rotation. Only abduction 20.  RADIOGRAPHS: AP pelvis and AP and lateral both hips show the prosthesis on the right in excellent position with no periprosthetic abnormalities. On the left he has severe bone on bone arthritis.   Assessment & Plan (Yong Grieser L. Jawaan Adachi III PA-C; 09/02/2014 5:18 PM) Primary osteoarthritis of left hip (M16.12) Note:Plan is for a Left Total Hip Replacement - anterior approach by Dr. Wynelle Link.  Plan is to go home with wife.  PCP - Dr. Redmond Pulling  Topical TXA - Prostate Cancer  No previous issues with anesthesia.  Signed electronically by Joelene Millin, III PA-C

## 2014-09-25 ENCOUNTER — Inpatient Hospital Stay (HOSPITAL_COMMUNITY): Payer: Medicare Other

## 2014-09-25 ENCOUNTER — Inpatient Hospital Stay (HOSPITAL_COMMUNITY)
Admission: RE | Admit: 2014-09-25 | Discharge: 2014-09-27 | DRG: 470 | Disposition: A | Discharge status: Home Health | Payer: Medicare Other | Source: Ambulatory Visit | Attending: Orthopedic Surgery | Admitting: Orthopedic Surgery

## 2014-09-25 ENCOUNTER — Encounter (HOSPITAL_COMMUNITY): Payer: Self-pay | Admitting: Anesthesiology

## 2014-09-25 ENCOUNTER — Inpatient Hospital Stay (HOSPITAL_COMMUNITY): Payer: Medicare Other | Admitting: Anesthesiology

## 2014-09-25 ENCOUNTER — Encounter (HOSPITAL_COMMUNITY)
Admission: RE | Disposition: A | Discharge status: Home Health | Payer: Self-pay | Source: Ambulatory Visit | Attending: Orthopedic Surgery

## 2014-09-25 DIAGNOSIS — Z8546 Personal history of malignant neoplasm of prostate: Secondary | ICD-10-CM | POA: Diagnosis not present

## 2014-09-25 DIAGNOSIS — M1612 Unilateral primary osteoarthritis, left hip: Secondary | ICD-10-CM

## 2014-09-25 DIAGNOSIS — Z87442 Personal history of urinary calculi: Secondary | ICD-10-CM

## 2014-09-25 DIAGNOSIS — Z96641 Presence of right artificial hip joint: Secondary | ICD-10-CM | POA: Diagnosis present

## 2014-09-25 DIAGNOSIS — Z923 Personal history of irradiation: Secondary | ICD-10-CM | POA: Diagnosis not present

## 2014-09-25 DIAGNOSIS — Z87440 Personal history of urinary (tract) infections: Secondary | ICD-10-CM | POA: Diagnosis not present

## 2014-09-25 DIAGNOSIS — Z936 Other artificial openings of urinary tract status: Secondary | ICD-10-CM

## 2014-09-25 DIAGNOSIS — I10 Essential (primary) hypertension: Secondary | ICD-10-CM | POA: Diagnosis present

## 2014-09-25 DIAGNOSIS — Z79899 Other long term (current) drug therapy: Secondary | ICD-10-CM | POA: Diagnosis not present

## 2014-09-25 DIAGNOSIS — Z9079 Acquired absence of other genital organ(s): Secondary | ICD-10-CM | POA: Diagnosis present

## 2014-09-25 DIAGNOSIS — Z96649 Presence of unspecified artificial hip joint: Secondary | ICD-10-CM

## 2014-09-25 DIAGNOSIS — M169 Osteoarthritis of hip, unspecified: Secondary | ICD-10-CM | POA: Diagnosis present

## 2014-09-25 DIAGNOSIS — Z7982 Long term (current) use of aspirin: Secondary | ICD-10-CM | POA: Diagnosis not present

## 2014-09-25 HISTORY — PX: TOTAL HIP ARTHROPLASTY: SHX124

## 2014-09-25 LAB — TYPE AND SCREEN
ABO/RH(D): A POS
Antibody Screen: NEGATIVE

## 2014-09-25 SURGERY — ARTHROPLASTY, HIP, TOTAL, ANTERIOR APPROACH
Anesthesia: Spinal | Site: Hip | Laterality: Left

## 2014-09-25 MED ORDER — SODIUM CHLORIDE 0.9 % IJ SOLN
INTRAMUSCULAR | Status: AC
  Filled 2014-09-25: qty 10

## 2014-09-25 MED ORDER — BUPIVACAINE HCL (PF) 0.5 % IJ SOLN
INTRAMUSCULAR | Status: AC
  Filled 2014-09-25: qty 30

## 2014-09-25 MED ORDER — METOCLOPRAMIDE HCL 10 MG PO TABS: 5.0000 mg | ORAL_TABLET | Freq: Three times a day (TID) | ORAL | Status: DC | PRN

## 2014-09-25 MED ORDER — BUPIVACAINE HCL (PF) 0.25 % IJ SOLN
INTRAMUSCULAR | Status: AC
  Filled 2014-09-25: qty 30

## 2014-09-25 MED ORDER — ONDANSETRON HCL 4 MG/2ML IJ SOLN: 4.0000 mg | Freq: Four times a day (QID) | INTRAMUSCULAR | Status: DC | PRN

## 2014-09-25 MED ORDER — DOCUSATE SODIUM 100 MG PO CAPS
100.0000 mg | ORAL_CAPSULE | Freq: Two times a day (BID) | ORAL | Status: DC
  Administered 2014-09-25 – 2014-09-27 (×4): 100 mg via ORAL

## 2014-09-25 MED ORDER — METHOCARBAMOL 1000 MG/10ML IJ SOLN
500.0000 mg | Freq: Four times a day (QID) | INTRAVENOUS | Status: DC | PRN
  Filled 2014-09-25: qty 5

## 2014-09-25 MED ORDER — ACETAMINOPHEN 650 MG RE SUPP: 650.0000 mg | Freq: Four times a day (QID) | RECTAL | Status: DC | PRN

## 2014-09-25 MED ORDER — BUPIVACAINE LIPOSOME 1.3 % IJ SUSP
20.0000 mL | Freq: Once | INTRAMUSCULAR | Status: DC
  Filled 2014-09-25: qty 20

## 2014-09-25 MED ORDER — PROMETHAZINE HCL 25 MG/ML IJ SOLN: 6.2500 mg | INTRAMUSCULAR | Status: DC | PRN

## 2014-09-25 MED ORDER — MIDAZOLAM HCL 2 MG/2ML IJ SOLN
INTRAMUSCULAR | Status: AC
  Filled 2014-09-25: qty 2

## 2014-09-25 MED ORDER — DIPHENHYDRAMINE HCL 12.5 MG/5ML PO ELIX: 12.5000 mg | ORAL_SOLUTION | ORAL | Status: DC | PRN

## 2014-09-25 MED ORDER — SODIUM CHLORIDE 0.9 % IJ SOLN
INTRAMUSCULAR | Status: AC
  Filled 2014-09-25: qty 50

## 2014-09-25 MED ORDER — BUPIVACAINE HCL (PF) 0.5 % IJ SOLN
INTRAMUSCULAR | Status: DC | PRN
  Administered 2014-09-25: 3 mL

## 2014-09-25 MED ORDER — CEFAZOLIN SODIUM-DEXTROSE 2-3 GM-% IV SOLR
2.0000 g | INTRAVENOUS | Status: AC
  Administered 2014-09-25: 2 g via INTRAVENOUS

## 2014-09-25 MED ORDER — PROPOFOL 10 MG/ML IV BOLUS
INTRAVENOUS | Status: DC | PRN
  Administered 2014-09-25: 20 mg via INTRAVENOUS

## 2014-09-25 MED ORDER — BISACODYL 10 MG RE SUPP: 10.0000 mg | Freq: Every day | RECTAL | Status: DC | PRN

## 2014-09-25 MED ORDER — CEFAZOLIN SODIUM-DEXTROSE 2-3 GM-% IV SOLR
2.0000 g | Freq: Four times a day (QID) | INTRAVENOUS | Status: AC
  Administered 2014-09-26: 2 g via INTRAVENOUS
  Filled 2014-09-25 (×2): qty 50

## 2014-09-25 MED ORDER — PHENYLEPHRINE HCL 10 MG/ML IJ SOLN
INTRAMUSCULAR | Status: AC
  Filled 2014-09-25: qty 1

## 2014-09-25 MED ORDER — FLEET ENEMA 7-19 GM/118ML RE ENEM: 1.0000 | ENEMA | Freq: Once | RECTAL | Status: AC | PRN

## 2014-09-25 MED ORDER — DEXAMETHASONE SODIUM PHOSPHATE 10 MG/ML IJ SOLN
INTRAMUSCULAR | Status: AC
  Filled 2014-09-25: qty 1

## 2014-09-25 MED ORDER — PROPOFOL INFUSION 10 MG/ML OPTIME
INTRAVENOUS | Status: DC | PRN
  Administered 2014-09-25: 100 ug/kg/min via INTRAVENOUS

## 2014-09-25 MED ORDER — PHENYLEPHRINE HCL 10 MG/ML IJ SOLN
10.0000 mg | INTRAVENOUS | Status: DC | PRN
  Administered 2014-09-25: 50 ug/min via INTRAVENOUS

## 2014-09-25 MED ORDER — BUPIVACAINE LIPOSOME 1.3 % IJ SUSP
INTRAMUSCULAR | Status: DC | PRN
  Administered 2014-09-25: 20 mL

## 2014-09-25 MED ORDER — LACTATED RINGERS IV SOLN
INTRAVENOUS | Status: DC
  Administered 2014-09-25: 13:00:00 via INTRAVENOUS
  Administered 2014-09-25: 1000 mL via INTRAVENOUS

## 2014-09-25 MED ORDER — ACETAMINOPHEN 500 MG PO TABS
1000.0000 mg | ORAL_TABLET | Freq: Four times a day (QID) | ORAL | Status: AC
  Administered 2014-09-25 – 2014-09-26 (×3): 1000 mg via ORAL
  Filled 2014-09-25 (×3): qty 2

## 2014-09-25 MED ORDER — DEXAMETHASONE SODIUM PHOSPHATE 10 MG/ML IJ SOLN
10.0000 mg | Freq: Once | INTRAMUSCULAR | Status: AC
  Administered 2014-09-25: 10 mg via INTRAVENOUS

## 2014-09-25 MED ORDER — MENTHOL 3 MG MT LOZG
1.0000 | LOZENGE | OROMUCOSAL | Status: DC | PRN
  Filled 2014-09-25: qty 9

## 2014-09-25 MED ORDER — MORPHINE SULFATE 2 MG/ML IJ SOLN
1.0000 mg | INTRAMUSCULAR | Status: DC | PRN
  Administered 2014-09-25 (×2): 1 mg via INTRAVENOUS
  Administered 2014-09-26: 2 mg via INTRAVENOUS
  Filled 2014-09-25 (×3): qty 1

## 2014-09-25 MED ORDER — ACETAMINOPHEN 10 MG/ML IV SOLN
1000.0000 mg | Freq: Once | INTRAVENOUS | Status: AC
  Administered 2014-09-25: 1000 mg via INTRAVENOUS
  Filled 2014-09-25: qty 100

## 2014-09-25 MED ORDER — EPHEDRINE SULFATE 50 MG/ML IJ SOLN
INTRAMUSCULAR | Status: DC | PRN
  Administered 2014-09-25 (×2): 10 mg via INTRAVENOUS

## 2014-09-25 MED ORDER — PROPOFOL 10 MG/ML IV BOLUS
INTRAVENOUS | Status: AC
  Filled 2014-09-25: qty 20

## 2014-09-25 MED ORDER — DEXAMETHASONE SODIUM PHOSPHATE 10 MG/ML IJ SOLN
10.0000 mg | Freq: Once | INTRAMUSCULAR | Status: AC
  Administered 2014-09-26: 10 mg via INTRAVENOUS
  Filled 2014-09-25: qty 1

## 2014-09-25 MED ORDER — PHENYLEPHRINE 40 MCG/ML (10ML) SYRINGE FOR IV PUSH (FOR BLOOD PRESSURE SUPPORT)
PREFILLED_SYRINGE | INTRAVENOUS | Status: AC
  Filled 2014-09-25: qty 10

## 2014-09-25 MED ORDER — OXYCODONE HCL 5 MG PO TABS
5.0000 mg | ORAL_TABLET | ORAL | Status: DC | PRN
  Administered 2014-09-25: 10 mg via ORAL
  Administered 2014-09-25: 5 mg via ORAL
  Administered 2014-09-26 – 2014-09-27 (×10): 10 mg via ORAL
  Filled 2014-09-25: qty 2
  Filled 2014-09-25: qty 1
  Filled 2014-09-25 (×11): qty 2

## 2014-09-25 MED ORDER — HYDROMORPHONE HCL 1 MG/ML IJ SOLN: 0.2500 mg | INTRAMUSCULAR | Status: DC | PRN

## 2014-09-25 MED ORDER — ONDANSETRON HCL 4 MG/2ML IJ SOLN
INTRAMUSCULAR | Status: AC
  Filled 2014-09-25: qty 2

## 2014-09-25 MED ORDER — BUPIVACAINE HCL (PF) 0.25 % IJ SOLN
INTRAMUSCULAR | Status: DC | PRN
  Administered 2014-09-25: 20 mL

## 2014-09-25 MED ORDER — FENTANYL CITRATE 0.05 MG/ML IJ SOLN
INTRAMUSCULAR | Status: AC
  Filled 2014-09-25: qty 2

## 2014-09-25 MED ORDER — SODIUM CHLORIDE 0.9 % IJ SOLN
INTRAMUSCULAR | Status: DC | PRN
  Administered 2014-09-25: 30 mL

## 2014-09-25 MED ORDER — LIDOCAINE HCL (CARDIAC) 20 MG/ML IV SOLN
INTRAVENOUS | Status: DC | PRN
  Administered 2014-09-25: 20 mg via INTRAVENOUS

## 2014-09-25 MED ORDER — EPHEDRINE SULFATE 50 MG/ML IJ SOLN
INTRAMUSCULAR | Status: AC
  Filled 2014-09-25: qty 1

## 2014-09-25 MED ORDER — TRANEXAMIC ACID 100 MG/ML IV SOLN
2000.0000 mg | INTRAVENOUS | Status: DC | PRN
  Administered 2014-09-25: 2000 mg via TOPICAL

## 2014-09-25 MED ORDER — CEFAZOLIN SODIUM-DEXTROSE 2-3 GM-% IV SOLR
INTRAVENOUS | Status: AC
  Filled 2014-09-25: qty 50

## 2014-09-25 MED ORDER — PHENYLEPHRINE HCL 10 MG/ML IJ SOLN
INTRAMUSCULAR | Status: DC | PRN
  Administered 2014-09-25 (×5): 80 ug via INTRAVENOUS

## 2014-09-25 MED ORDER — TRAMADOL HCL 50 MG PO TABS
50.0000 mg | ORAL_TABLET | Freq: Four times a day (QID) | ORAL | Status: DC | PRN
  Administered 2014-09-26: 100 mg via ORAL
  Filled 2014-09-25: qty 2

## 2014-09-25 MED ORDER — RIVAROXABAN 10 MG PO TABS
10.0000 mg | ORAL_TABLET | Freq: Every day | ORAL | Status: DC
  Administered 2014-09-26 – 2014-09-27 (×2): 10 mg via ORAL
  Filled 2014-09-25 (×3): qty 1

## 2014-09-25 MED ORDER — SODIUM CHLORIDE 0.9 % IV SOLN: INTRAVENOUS | Status: DC

## 2014-09-25 MED ORDER — POLYETHYLENE GLYCOL 3350 17 G PO PACK: 17.0000 g | PACK | Freq: Every day | ORAL | Status: DC | PRN

## 2014-09-25 MED ORDER — FENTANYL CITRATE 0.05 MG/ML IJ SOLN
INTRAMUSCULAR | Status: DC | PRN
  Administered 2014-09-25 (×2): 50 ug via INTRAVENOUS

## 2014-09-25 MED ORDER — METOCLOPRAMIDE HCL 5 MG/ML IJ SOLN: 5.0000 mg | Freq: Three times a day (TID) | INTRAMUSCULAR | Status: DC | PRN

## 2014-09-25 MED ORDER — PHENOL 1.4 % MT LIQD: 1.0000 | OROMUCOSAL | Status: DC | PRN

## 2014-09-25 MED ORDER — METHOCARBAMOL 500 MG PO TABS
500.0000 mg | ORAL_TABLET | Freq: Four times a day (QID) | ORAL | Status: DC | PRN
  Administered 2014-09-26 – 2014-09-27 (×5): 500 mg via ORAL
  Filled 2014-09-25 (×6): qty 1

## 2014-09-25 MED ORDER — ONDANSETRON HCL 4 MG PO TABS: 4.0000 mg | ORAL_TABLET | Freq: Four times a day (QID) | ORAL | Status: DC | PRN

## 2014-09-25 MED ORDER — CHLORHEXIDINE GLUCONATE 4 % EX LIQD: 60.0000 mL | Freq: Once | CUTANEOUS | Status: DC

## 2014-09-25 MED ORDER — KETOROLAC TROMETHAMINE 15 MG/ML IJ SOLN
7.5000 mg | Freq: Four times a day (QID) | INTRAMUSCULAR | Status: AC | PRN
  Administered 2014-09-26: 7.5 mg via INTRAVENOUS
  Filled 2014-09-25: qty 1

## 2014-09-25 MED ORDER — SODIUM CHLORIDE 0.9 % IV SOLN
INTRAVENOUS | Status: DC
  Administered 2014-09-25: 23:00:00 via INTRAVENOUS

## 2014-09-25 MED ORDER — ACETAMINOPHEN 325 MG PO TABS: 650.0000 mg | ORAL_TABLET | Freq: Four times a day (QID) | ORAL | Status: DC | PRN

## 2014-09-25 MED ORDER — TRANEXAMIC ACID 100 MG/ML IV SOLN
2000.0000 mg | Freq: Once | INTRAVENOUS | Status: DC
  Filled 2014-09-25: qty 20

## 2014-09-25 SURGICAL SUPPLY — 38 items
BAG ZIPLOCK 12X15 (MISCELLANEOUS) IMPLANT
BLADE EXTENDED COATED 6.5IN (ELECTRODE) ×2 IMPLANT
BLADE SAG 18X100X1.27 (BLADE) ×2 IMPLANT
CAPT HIP TOTAL 2 ×2 IMPLANT
COVER PERINEAL POST (MISCELLANEOUS) ×2 IMPLANT
DECANTER SPIKE VIAL GLASS SM (MISCELLANEOUS) ×2 IMPLANT
DRAPE C-ARM 42X120 X-RAY (DRAPES) ×2 IMPLANT
DRAPE STERI IOBAN 125X83 (DRAPES) ×2 IMPLANT
DRAPE U-SHAPE 47X51 STRL (DRAPES) ×6 IMPLANT
DRSG ADAPTIC 3X8 NADH LF (GAUZE/BANDAGES/DRESSINGS) ×2 IMPLANT
DRSG MEPILEX BORDER 4X4 (GAUZE/BANDAGES/DRESSINGS) ×2 IMPLANT
DRSG MEPILEX BORDER 4X8 (GAUZE/BANDAGES/DRESSINGS) ×2 IMPLANT
DURAPREP 26ML APPLICATOR (WOUND CARE) ×2 IMPLANT
ELECT REM PT RETURN 9FT ADLT (ELECTROSURGICAL) ×2
ELECTRODE REM PT RTRN 9FT ADLT (ELECTROSURGICAL) ×1 IMPLANT
EVACUATOR 1/8 PVC DRAIN (DRAIN) ×2 IMPLANT
FACESHIELD WRAPAROUND (MASK) ×8 IMPLANT
GLOVE BIO SURGEON STRL SZ7.5 (GLOVE) ×2 IMPLANT
GLOVE BIO SURGEON STRL SZ8 (GLOVE) ×2 IMPLANT
GLOVE BIOGEL PI IND STRL 8 (GLOVE) ×2 IMPLANT
GLOVE BIOGEL PI INDICATOR 8 (GLOVE) ×2
GOWN STRL REUS W/TWL LRG LVL3 (GOWN DISPOSABLE) ×2 IMPLANT
GOWN STRL REUS W/TWL XL LVL3 (GOWN DISPOSABLE) ×2 IMPLANT
KIT BASIN OR (CUSTOM PROCEDURE TRAY) ×2 IMPLANT
NDL SAFETY ECLIPSE 18X1.5 (NEEDLE) ×3 IMPLANT
NEEDLE HYPO 18GX1.5 SHARP (NEEDLE) ×3
PACK TOTAL JOINT (CUSTOM PROCEDURE TRAY) ×2 IMPLANT
STRIP CLOSURE SKIN 1/2X4 (GAUZE/BANDAGES/DRESSINGS) ×2 IMPLANT
SUT ETHIBOND NAB CT1 #1 30IN (SUTURE) ×2 IMPLANT
SUT MNCRL AB 4-0 PS2 18 (SUTURE) ×2 IMPLANT
SUT VIC AB 2-0 CT1 27 (SUTURE) ×2
SUT VIC AB 2-0 CT1 TAPERPNT 27 (SUTURE) ×2 IMPLANT
SUT VLOC 180 0 24IN GS25 (SUTURE) ×2 IMPLANT
SYR 20CC LL (SYRINGE) ×2 IMPLANT
SYR 50ML LL SCALE MARK (SYRINGE) ×4 IMPLANT
TOWEL OR 17X26 10 PK STRL BLUE (TOWEL DISPOSABLE) ×2 IMPLANT
TRAY FOLEY CATH 14FRSI W/METER (CATHETERS) IMPLANT
TRAY FOLEY CATH 16FRSI W/METER (SET/KITS/TRAYS/PACK) ×2 IMPLANT

## 2014-09-25 NOTE — H&P (View-Only) (Signed)
Kevin Stafford DOB: [birth year] Married / Language: English / Race: White Male Date of Admission:  09/25/2014 CC:  Left Hip Pain History of Present Illness (Alexzandrew L. Perkins III PA-C; 09/02/2014 5:18 PM) The patient is a 69 year old male who comes in for a preoperative History and Physical. The patient is scheduled for a left total hip arthroplasty (anterior approach) to be performed by Dr. Dione Plover. Aluisio, MD at [facility] on 09-25-2014. The patient comes in out from right total hip arthroplasty (anterior approach). The patient states that he is doing very well at this time. The pain is under excellent control at this time and describes their pain as mild. The patient is currently doing home exercise program. The patient feels that they are progressing well at this time. The left hip is now the limiting factor with his mobility. He has done well with the right hip and is looking forward to getting the left hip done. They have been treated conservatively in the past for the above stated problem and despite conservative measures, they continue to have progressive pain and severe functional limitations and dysfunction. They have failed non-operative management including home exercise, medications, and injections. It is felt that they would benefit from undergoing total joint replacement. Risks and benefits of the procedure have been discussed with the patient and they elect to proceed with surgery. There are no active contraindications to surgery such as ongoing infection or rapidly progressive neurological disease.   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) Prostate Disease Kidney Stone Prostate Cancer  Allergies  No Known Drug Allergies  Family History (Alexzandrew Monika Salk, III PA-C; 09/02/2014 5:05 PM) Diabetes Mellitus father and grandmother mothers  side Heart Disease grandfather mothers side Cancer mother, grandmother mothers side, grandmother fathers side and grandfather fathers side Hypertension father Osteoarthritis mother, grandmother mothers side and grandfather mothers side Osteoporosis mother and grandmother mothers side  Social History (Alexzandrew Monika Salk, III PA-C; 09/02/2014 5:05 PM) Tobacco use never smoker Tobacco / smoke exposure no Number of flights of stairs before winded greater than 5 Pain Contract no Previously in rehab no 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 Multivitamin (Oral) Active. Blood Pressure Pill Active.  Past Surgical History (Alexzandrew L Perkins, III PA-C; 09/02/2014 5:07 PM) Colon Polyp Removal - Colonoscopy Foot Surgery bilateral Other Surgery Supra Pubic catheter, prostate ablation, mo's procedure r arm Prostatectomy; Transurethral Urostomy Procedure Located right abdomen   Review of Systems (Alexzandrew L. Perkins III PA-C; 09/02/2014 5:08 PM) General Not Present- Chills, Fatigue, Fever, Memory Loss, Night Sweats, Weight Gain and Weight Loss. Skin Not Present- Eczema, Hives, Itching, Lesions and Rash. HEENT Not Present- Dentures, Double Vision, Headache, Hearing Loss, Tinnitus and Visual Loss. Respiratory Not Present- Allergies, Chronic Cough, Coughing up blood, Shortness of breath at rest and Shortness of breath with exertion. Cardiovascular Not Present- Chest Pain, Difficulty Breathing Lying Down, Murmur, Palpitations, Racing/skipping heartbeats  and Swelling. Gastrointestinal Not Present- Abdominal Pain, Bloody Stool, Constipation, Diarrhea, Difficulty Swallowing, Heartburn, Jaundice, Loss of appetitie, Nausea and Vomiting. Male Genitourinary Not  Present- Blood in Urine, Discharge, Flank Pain, Incontinence, Painful Urination, Urgency, Urinary frequency, Urinary Retention, Urinating at Night and Weak urinary stream. Musculoskeletal Present- Joint Pain. Not Present- Back Pain, Joint Swelling, Morning Stiffness, Muscle Pain, Muscle Weakness and Spasms. Neurological Not Present- Blackout spells, Difficulty with balance, Dizziness, Paralysis, Tremor and Weakness. Psychiatric Not Present- Insomnia.   Vitals  Weight: 228 lb Height: 74in Weight was reported by patient. Height was reported by patient. Body Surface Area: 2.3 m Body Mass Index: 29.27 kg/m  Pulse: 84 (Regular)  BP: 134/84 (Sitting, Right Arm, Standard)   Physical Exam (Alexzandrew L. Perkins III PA-C; 09/02/2014 5:07 PM) The physical exam findings are as follows: Note:Patient is a 69 year old male with continued hip pain.  General Mental Status -Alert, cooperative and good historian. General Appearance-pleasant, Not in acute distress. Orientation-Oriented X3. Build & Nutrition-Well nourished and Well developed. Note: Tall Framed  Head and Neck Head-normocephalic, atraumatic . Neck Global Assessment - supple, no bruit auscultated on the right, no bruit auscultated on the left.  Eye Pupil - Bilateral-Regular and Round. Motion - Bilateral-EOMI.  Chest and Lung Exam Auscultation Breath sounds - clear at anterior chest wall and clear at posterior chest wall. Adventitious sounds - No Adventitious sounds.  Cardiovascular Auscultation Rhythm - Regular rate and rhythm. Heart Sounds - S1 WNL and S2 WNL. Murmurs & Other Heart Sounds - Auscultation of the heart reveals - No Murmurs.  Abdomen Inspection Inspection of the abdomen reveals - Note:  Urostomy bag right abdomen. Palpation/Percussion Tenderness - Abdomen is non-tender to palpation. Rigidity (guarding) - Abdomen is soft. Auscultation Auscultation of the abdomen reveals - Bowel sounds normal.  Male Genitourinary Note: Not done, not pertinent to present illness   Musculoskeletal Note: On exam, well developed male in no distress. Laszlo is walking without a limp with no assist device. I can flex his right hip to 120, rotate in 30, out 40 and abduct 40 without discomfort. Left hip flexion to 90. No internal or external rotation. Only abduction 20.  RADIOGRAPHS: AP pelvis and AP and lateral both hips show the prosthesis on the right in excellent position with no periprosthetic abnormalities. On the left he has severe bone on bone arthritis.   Assessment & Plan (Alexzandrew L. Perkins III PA-C; 09/02/2014 5:18 PM) Primary osteoarthritis of left hip (M16.12) Note:Plan is for a Left Total Hip Replacement - anterior approach by Dr. Wynelle Link.  Plan is to go home with wife.  PCP - Dr. Redmond Pulling  Topical TXA - Prostate Cancer  No previous issues with anesthesia.  Signed electronically by Joelene Millin, III PA-C

## 2014-09-25 NOTE — Anesthesia Procedure Notes (Signed)
Spinal Patient location during procedure: OR End time: 09/25/2014 12:04 PM Staffing Anesthesiologist: Salley Scarlet Resident/CRNA: Lajuana Carry E Performed by: resident/CRNA  Preanesthetic Checklist Completed: patient identified, site marked, surgical consent, pre-op evaluation, timeout performed, IV checked, risks and benefits discussed and monitors and equipment checked Spinal Block Patient position: sitting Prep: Betadine Patient monitoring: heart rate, continuous pulse ox and blood pressure Approach: midline Location: L2-3 Injection technique: single-shot Needle Needle type: Spinocan  Needle gauge: 22 G Needle length: 9 cm Assessment Sensory level: T6 Additional Notes Expiration date of kit checked and confirmed. Attepmt with 24 g sprotte w/o success, Clear CSF, neg heme, neg paresthesia w/ 22g.  Patient tolerated procedure well, without complications.

## 2014-09-25 NOTE — Op Note (Signed)
OPERATIVE REPORT  PREOPERATIVE DIAGNOSIS: Osteoarthritis of the Left hip.   POSTOPERATIVE DIAGNOSIS: Osteoarthritis of the Left  hip.   PROCEDURE: Left total hip arthroplasty, anterior approach.   SURGEON: Gaynelle Arabian, MD   ASSISTANT: Arlee Muslim, PA-C  ANESTHESIA:  Spinal  ESTIMATED BLOOD LOSS:- 200 ml   DRAINS: Hemovac x1.   COMPLICATIONS: None   CONDITION: PACU - hemodynamically stable.   BRIEF CLINICAL NOTE: Kevin Stafford is a 69 y.o. male who has advanced end-  stage arthritis of his Left  hip with progressively worsening pain and  dysfunction.The patient has failed nonoperative management and presents for  total hip arthroplasty.   PROCEDURE IN DETAIL: After successful administration of spinal  anesthetic, the traction boots for the [facility] bed were placed on both  feet and the patient was placed onto the [facility] bed, boots placed into the leg  holders. The Left hip was then isolated from the perineum with plastic  drapes and prepped and draped in the usual sterile fashion. ASIS and  greater trochanter were marked and a oblique incision was made, starting  at about 1 cm lateral and 2 cm distal to the ASIS and coursing towards  the anterior cortex of the femur. The skin was cut with a 10 blade  through subcutaneous tissue to the level of the fascia overlying the  tensor fascia lata muscle. The fascia was then incised in line with the  incision at the junction of the anterior third and posterior 2/3rd. The  muscle was teased off the fascia and then the interval between the TFL  and the rectus was developed. The Hohmann retractor was then placed at  the top of the femoral neck over the capsule. The vessels overlying the  capsule were cauterized and the fat on top of the capsule was removed.  A Hohmann retractor was then placed anterior underneath the rectus  femoris to give exposure to the entire anterior capsule. A T-shaped  capsulotomy was performed. The edges  were tagged and the femoral head  was identified.       Osteophytes are removed off the superior acetabulum.  The femoral neck was then cut in situ with an oscillating saw. Traction  was then applied to the left lower extremity utilizing the [facility]  traction. The femoral head was then removed. Retractors were placed  around the acetabulum and then circumferential removal of the labrum was  performed. Osteophytes were also removed. Reaming starts at 47 mm to  medialize and  Increased in 2 mm increments to 53 mm. We reamed in  approximately 40 degrees of abduction, 20 degrees anteversion. A 54 mm  pinnacle acetabular shell was then impacted in anatomic position under  fluoroscopic guidance with excellent purchase. We did not need to place  any additional dome screws. A 36 mm neutral + 4 marathon liner was then  placed into the acetabular shell.       The femoral lift was then placed along the lateral aspect of the femur  just distal to the vastus ridge. The leg was  externally rotated and capsule  was stripped off the inferior aspect of the femoral neck down to the  level of the lesser trochanter, this was done with electrocautery. The femur was lifted after this was performed. The  leg was then placed and extended in adducted position to essentially delivering the femur. We also removed the capsule superiorly and the  piriformis from the  piriformis fossa to gain excellent exposure of the  proximal femur. Rongeur was used to remove some cancellous bone to get  into the lateral portion of the proximal femur for placement of the  initial starter reamer. The starter broaches was placed  the starter broach  and was shown to go down the center of the canal. Broaching  with the  Corail system was then performed starting at size 8, coursing  Up to size 12. A size 12 had excellent torsional and rotational  and axial stability. The trial standard offset neck was then placed  with a 36 + 1.5 trial head.  The hip was then reduced. We confirmed that  the stem was in the canal both on AP and lateral x-rays. It also has excellent sizing. The hip was reduced with outstanding stability through full extension, full external rotation,  and then flexion in adduction internal rotation. AP pelvis was taken  and the leg lengths were measured and found to be exactly equal. Hip  was then dislocated again and the femoral head and neck removed. The  femoral broach was removed. Size 12 Corail stem with a standard offset  neck was then impacted into the femur following native anteversion. Has  excellent purchase in the canal. Excellent torsional and rotational and  axial stability. It is confirmed to be in the canal on AP and lateral  fluoroscopic views. The 36 + 1.5 ceramic head was placed and the hip  reduced with outstanding stability. Again AP pelvis was taken and it  confirmed that the leg lengths were equal. The wound was then copiously  irrigated with saline solution and the capsule reattached and repaired  with Ethibond suture.  20 mL of Exparel mixed with 50 mL of saline then additional 20 ml of .25% Bupivicaine injected into the capsule and into the edge of the tensor fascia lata as well as subcutaneous tissue. The fascia overlying the tensor fascia lata was  then closed with a running #1 V-Loc. Subcu was closed with interrupted  2-0 Vicryl and subcuticular running 4-0 Monocryl. Incision was cleaned  and dried. Steri-Strips and a bulky sterile dressing applied. Hemovac  drain was hooked to suction and then he was awakened and transported to  recovery in stable condition.        Please note that a surgical assistant was a medical necessity for this procedure to perform it in a safe and expeditious manner. Assistant was necessary to provide appropriate retraction of vital neurovascular structures and to prevent femoral fracture and allow for anatomic placement of the prosthesis.  Gaynelle Arabian, M.D.

## 2014-09-25 NOTE — Anesthesia Postprocedure Evaluation (Signed)
  Anesthesia Post-op Note  Patient: Kevin Stafford  Procedure(s) Performed: Procedure(s) (LRB): LEFT TOTAL HIP ARTHROPLASTY ANTERIOR APPROACH (Left)  Patient Location: PACU  Anesthesia Type: Spinal  Level of Consciousness: awake and alert   Airway and Oxygen Therapy: Patient Spontanous Breathing  Post-op Pain: mild  Post-op Assessment: Post-op Vital signs reviewed, Patient's Cardiovascular Status Stable, Respiratory Function Stable, Patent Airway and No signs of Nausea or vomiting  Last Vitals:  Filed Vitals:   09/25/14 1721  BP: 128/80  Pulse: 91  Temp: 36.4 C  Resp: 18    Post-op Vital Signs: stable   Complications: No apparent anesthesia complications

## 2014-09-25 NOTE — Interval H&P Note (Signed)
History and Physical Interval Note:  09/25/2014 11:42 AM  Kevin Stafford  has presented today for surgery, with the diagnosis of OA LEFT HIP  The various methods of treatment have been discussed with the patient and family. After consideration of risks, benefits and other options for treatment, the patient has consented to  Procedure(s): LEFT TOTAL HIP ARTHROPLASTY ANTERIOR APPROACH (Left) as a surgical intervention .  The patient's history has been reviewed, patient examined, no change in status, stable for surgery.  I have reviewed the patient's chart and labs.  Questions were answered to the patient's satisfaction.     Gearlean Alf

## 2014-09-25 NOTE — Anesthesia Preprocedure Evaluation (Addendum)
Anesthesia Evaluation  Patient identified by MRN, date of birth, ID band Patient awake    Reviewed: Allergy & Precautions, NPO status , Patient's Chart, lab work & pertinent test results  Airway Mallampati: II  TM Distance: >3 FB Neck ROM: Full    Dental no notable dental hx.    Pulmonary neg pulmonary ROS,  breath sounds clear to auscultation  Pulmonary exam normal       Cardiovascular Exercise Tolerance: Good hypertension, Pt. on medications Rhythm:Regular Rate:Normal     Neuro/Psych negative neurological ROS  negative psych ROS   GI/Hepatic negative GI ROS, Neg liver ROS,   Endo/Other  negative endocrine ROS  Renal/GU negative Renal ROS  negative genitourinary   Musculoskeletal  (+) Arthritis -,   Abdominal   Peds negative pediatric ROS (+)  Hematology negative hematology ROS (+)   Anesthesia Other Findings   Reproductive/Obstetrics negative OB ROS                            Anesthesia Physical Anesthesia Plan  ASA: II  Anesthesia Plan: Spinal   Post-op Pain Management:    Induction: Intravenous  Airway Management Planned:   Additional Equipment:   Intra-op Plan:   Post-operative Plan: Extubation in OR  Informed Consent: I have reviewed the patients History and Physical, chart, labs and discussed the procedure including the risks, benefits and alternatives for the proposed anesthesia with the patient or authorized representative who has indicated his/her understanding and acceptance.   Dental advisory given  Plan Discussed with: CRNA  Anesthesia Plan Comments: (Discussed spinal and general. Had spinal for last surgery. Discussed risks/benefits of spinal including headache, backache, failure, bleeding, infection, and nerve damage. Patient consents to spinal. Questions answered. Coagulation studies and platelet count acceptable. Last platelet count 280 K.)        Anesthesia Quick Evaluation

## 2014-09-25 NOTE — Transfer of Care (Signed)
Immediate Anesthesia Transfer of Care Note  Patient: Kevin Stafford  Procedure(s) Performed: Procedure(s) (LRB): LEFT TOTAL HIP ARTHROPLASTY ANTERIOR APPROACH (Left)  Patient Location: PACU  Anesthesia Type: Spinal  Level of Consciousness: sedated, patient cooperative and responds to stimulation  Airway & Oxygen Therapy: Patient Spontanous Breathing and Patient connected to face mask oxgen  Post-op Assessment: Report given to PACU RN and Post -op Vital signs reviewed and stable  Post vital signs: Reviewed and stable  Complications: No apparent anesthesia complications

## 2014-09-25 NOTE — Progress Notes (Signed)
Utilization review completed.  

## 2014-09-26 LAB — BASIC METABOLIC PANEL
Anion gap: 10 (ref 5–15)
BUN: 19 mg/dL (ref 6–23)
CALCIUM: 7.9 mg/dL — AB (ref 8.4–10.5)
CHLORIDE: 109 mmol/L (ref 96–112)
CO2: 20 mmol/L (ref 19–32)
CREATININE: 0.82 mg/dL (ref 0.50–1.35)
GFR calc Af Amer: >90 mL/min (ref >90)
GFR calc non Af Amer: >90 mL/min (ref >90)
Glucose, Bld: 123 mg/dL — ABNORMAL HIGH (ref 70–99)
Potassium: 4.3 mmol/L (ref 3.5–5.1)
Sodium: 139 mmol/L (ref 135–145)

## 2014-09-26 LAB — CBC
HEMATOCRIT: 38.1 % — AB (ref 39.0–52.0)
Hemoglobin: 11.9 g/dL — ABNORMAL LOW (ref 13.0–17.0)
MCH: 26.9 pg (ref 26.0–34.0)
MCHC: 31.2 g/dL (ref 30.0–36.0)
MCV: 86 fL (ref 78.0–100.0)
PLATELETS: 254 10*3/uL (ref 150–400)
RBC: 4.43 MIL/uL (ref 4.22–5.81)
RDW: 15.7 % — ABNORMAL HIGH (ref 11.5–15.5)
WBC: 11.4 10*3/uL — ABNORMAL HIGH (ref 4.0–10.5)

## 2014-09-26 MED ORDER — METHOCARBAMOL 500 MG PO TABS: 500.0000 mg | ORAL_TABLET | Freq: Four times a day (QID) | ORAL | Status: DC | PRN

## 2014-09-26 MED ORDER — RIVAROXABAN 10 MG PO TABS: 10.0000 mg | ORAL_TABLET | Freq: Every day | ORAL | Status: AC

## 2014-09-26 MED ORDER — OXYCODONE HCL 5 MG PO TABS: 5.0000 mg | ORAL_TABLET | ORAL | Status: DC | PRN

## 2014-09-26 MED ORDER — TRAMADOL HCL 50 MG PO TABS: 50.0000 mg | ORAL_TABLET | Freq: Four times a day (QID) | ORAL | Status: DC | PRN

## 2014-09-26 NOTE — Evaluation (Signed)
Physical Therapy Evaluation Patient Details Name: Kevin Stafford MRN: [id number] DOB: 1946-05-13 Today's Date: 09/26/2014   History of Present Illness  L THR; s/p R THR 9/15  Clinical Impression  Pt s/p L THR presents with decreased L LE strength/ROM and post op pain limiting functional mobility.  Pt should progress well to d.c home with family assist and HHPT follow up.    Follow Up Recommendations Home health PT    Equipment Recommendations  None recommended by PT    Recommendations for Other Services OT consult     Precautions / Restrictions Precautions Precautions: Fall Restrictions Weight Bearing Restrictions: No Other Position/Activity Restrictions: WBAT      Mobility  Bed Mobility Overal bed mobility: Needs Assistance Bed Mobility: Supine to Sit     Supine to sit: Min assist     General bed mobility comments: cues for sequence and use of R LE to self assist  Transfers Overall transfer level: Needs assistance Equipment used: Rolling walker (2 wheeled) Transfers: Sit to/from Stand Sit to Stand: Min guard         General transfer comment: cues for LE management and use of UEs to self assist  Ambulation/Gait Ambulation/Gait assistance: Min guard Ambulation Distance (Feet): 222 Feet Assistive device: Rolling walker (2 wheeled) Gait Pattern/deviations: Step-to pattern Gait velocity: mod pace   General Gait Details: cues for posture, position from RW, ER on L and initial sequence  Stairs            Wheelchair Mobility    Modified Rankin (Stroke Patients Only)       Balance                                             Pertinent Vitals/Pain Pain Assessment: 0-10 Pain Score: 2  Pain Location: L hip Pain Descriptors / Indicators: Aching Pain Intervention(s): Limited activity within patient's tolerance;Monitored during session;Premedicated before session;Ice applied    Home Living Family/patient expects to be discharged  to:: Private residence Living Arrangements: Spouse/significant other Available Help at Discharge: Family Type of Home: House Home Access: Stairs to enter Entrance Stairs-Rails: Left Entrance Stairs-Number of Steps: 3 Home Layout: Able to live on main level with bedroom/bathroom Home Equipment: Walker - 2 wheels;Bedside commode;Grab bars - tub/shower;Shower seat - built in      Prior Function Level of Independence: Independent               Journalist, newspaper        Extremity/Trunk Assessment   Upper Extremity Assessment: Overall WFL for tasks assessed           Lower Extremity Assessment: LLE deficits/detail   LLE Deficits / Details: hip strength 3-/5 with AAROM at hip to 90 flex and 15 abd  Cervical / Trunk Assessment: Normal  Communication   Communication: No difficulties  Cognition Arousal/Alertness: Awake/alert Behavior During Therapy: WFL for tasks assessed/performed Overall Cognitive Status: Within Functional Limits for tasks assessed                      General Comments      Exercises Total Joint Exercises Ankle Circles/Pumps: AROM;Both;15 reps;Supine Quad Sets: AROM;Both;10 reps;Supine Heel Slides: AAROM;Left;Supine;20 reps Hip ABduction/ADduction: AAROM;Left;15 reps;Supine      Assessment/Plan    PT Assessment Patient needs continued PT services  PT Diagnosis Difficulty walking   PT Problem List  Decreased strength;Decreased range of motion;Decreased activity tolerance;Decreased mobility;Decreased knowledge of use of DME;Pain  PT Treatment Interventions DME instruction;Gait training;Stair training;Functional mobility training;Therapeutic activities;Therapeutic exercise;Patient/family education   PT Goals (Current goals can be found in the Care Plan section) Acute Rehab PT Goals Patient Stated Goal: Resume previous active lifestyle with decreased pain PT Goal Formulation: With patient Time For Goal Achievement: 10/03/14 Potential to  Achieve Goals: Good    Frequency 7X/week   Barriers to discharge        Co-evaluation               End of Session Equipment Utilized During Treatment: Gait belt Activity Tolerance: Patient tolerated treatment well Patient left: in chair;with call bell/phone within reach Nurse Communication: Mobility status         Time: 1638-4536 PT Time Calculation (min) (ACUTE ONLY): 33 min   Charges:   PT Evaluation $Initial PT Evaluation Tier I: 1 Procedure PT Treatments $Therapeutic Exercise: 8-22 mins   PT G Codes:        Prajwal Fellner 10/03/2014, 12:19 PM

## 2014-09-26 NOTE — Discharge Summary (Signed)
Physician Discharge Summary   Patient ID: BEREL NAJJAR MRN: [id number] DOB/AGE: 67-May-1947 67 y.o.  Admit date: 09/25/2014 Discharge date: 09/27/2014  Primary Diagnosis:  Osteoarthritis of the Left hip.  Admission Diagnoses:  Past Medical History  Diagnosis Date  . Nocturia   . Recurrent UTI   . Prostate cancer S/P RADIOACTIVE SEED IMPLANTS 2008 AND CRYOABLATION OF PROSTATE [id number]  . Bladder retention of urine     foley since  09/27/11  . Osteomyelitis of pelvis   . Toxicity, radiation     "radiation in 2008-too much, bladder shrunk and prostate exploded"2012 s/s started so went to [facility]  . Hypertension   . History of kidney stones   . Arthritis   . History of urostomy    Discharge Diagnoses:   Active Problems:   OA (osteoarthritis) of hip  Estimated body mass index is 29.37 kg/(m^2) as calculated from the following:   Height as of this encounter: '6\' 3"'  (1.905 m).   Weight as of this encounter: 106.595 kg (235 lb).  Procedure(s) (LRB): LEFT TOTAL HIP ARTHROPLASTY ANTERIOR APPROACH (Left)   Consults: None  HPI: Kevin Stafford is a 69 y.o. male who has advanced end-  stage arthritis of his Left hip with progressively worsening pain and  dysfunction.The patient has failed nonoperative management and presents for  total hip arthroplasty.   Laboratory Data: Admission on 09/25/2014, Discharged on 09/27/2014  Component Date Value Ref Range Status  . ABO/RH(D) 09/25/2014 A POS   Final  . Antibody Screen 09/25/2014 NEG   Final  . Sample Expiration 09/25/2014 09/28/2014   Final  . WBC 09/26/2014 11.4* 4.0 - 10.5 K/uL Final  . RBC 09/26/2014 4.43  4.22 - 5.81 MIL/uL Final  . Hemoglobin 09/26/2014 11.9* 13.0 - 17.0 g/dL Final  . HCT 09/26/2014 38.1* 39.0 - 52.0 % Final  . MCV 09/26/2014 86.0  78.0 - 100.0 fL Final  . MCH 09/26/2014 26.9  26.0 - 34.0 pg Final  . MCHC 09/26/2014 31.2  30.0 - 36.0 g/dL Final  . RDW 09/26/2014 15.7* 11.5 - 15.5 % Final  . Platelets  09/26/2014 254  150 - 400 K/uL Final  . Sodium 09/26/2014 139  135 - 145 mmol/L Final  . Potassium 09/26/2014 4.3  3.5 - 5.1 mmol/L Final  . Chloride 09/26/2014 109  96 - 112 mmol/L Final  . CO2 09/26/2014 20  19 - 32 mmol/L Final  . Glucose, Bld 09/26/2014 123* 70 - 99 mg/dL Final  . BUN 09/26/2014 19  6 - 23 mg/dL Final  . Creatinine, Ser 09/26/2014 0.82  0.50 - 1.35 mg/dL Final  . Calcium 09/26/2014 7.9* 8.4 - 10.5 mg/dL Final  . GFR calc non Af Amer 09/26/2014 >90  >90 mL/min Final  . GFR calc Af Amer 09/26/2014 >90  >90 mL/min Final   Comment: (NOTE) The eGFR has been calculated using the CKD EPI equation. This calculation has not been validated in all clinical situations. eGFR's persistently <90 mL/min signify possible Chronic Kidney Disease.   . Anion gap 09/26/2014 10  5 - 15 Final  . WBC 09/27/2014 10.0  4.0 - 10.5 K/uL Final  . RBC 09/27/2014 4.29  4.22 - 5.81 MIL/uL Final  . Hemoglobin 09/27/2014 11.7* 13.0 - 17.0 g/dL Final  . HCT 09/27/2014 36.8* 39.0 - 52.0 % Final  . MCV 09/27/2014 85.8  78.0 - 100.0 fL Final  . MCH 09/27/2014 27.3  26.0 - 34.0 pg Final  . MCHC 09/27/2014 31.8  30.0 - 36.0 g/dL Final  . RDW 09/27/2014 15.9* 11.5 - 15.5 % Final  . Platelets 09/27/2014 202  150 - 400 K/uL Final  . Sodium 09/27/2014 140  135 - 145 mmol/L Final  . Potassium 09/27/2014 5.2* 3.5 - 5.1 mmol/L Final   Comment: DELTA CHECK NOTED REPEATED TO VERIFY NO VISIBLE HEMOLYSIS   . Chloride 09/27/2014 107  96 - 112 mmol/L Final  . CO2 09/27/2014 26  19 - 32 mmol/L Final  . Glucose, Bld 09/27/2014 105* 70 - 99 mg/dL Final  . BUN 09/27/2014 21  6 - 23 mg/dL Final  . Creatinine, Ser 09/27/2014 0.83  0.50 - 1.35 mg/dL Final  . Calcium 09/27/2014 8.3* 8.4 - 10.5 mg/dL Final  . GFR calc non Af Amer 09/27/2014 90* >90 mL/min Final  . GFR calc Af Amer 09/27/2014 >90  >90 mL/min Final   Comment: (NOTE) The eGFR has been calculated using the CKD EPI equation. This calculation has not  been validated in all clinical situations. eGFR's persistently <90 mL/min signify possible Chronic Kidney Disease.   Georgiann Hahn gap 09/27/2014 7  5 - 15 Final  Hospital Outpatient Visit on 09/18/2014  Component Date Value Ref Range Status  . aPTT 09/18/2014 25  24 - 37 seconds Final  . Sodium 09/18/2014 140  135 - 145 mmol/L Final   Please note change in reference range.  . Potassium 09/18/2014 4.3  3.5 - 5.1 mmol/L Final   Please note change in reference range.  . Chloride 09/18/2014 104  96 - 112 mEq/L Final  . CO2 09/18/2014 28  19 - 32 mmol/L Final  . Glucose, Bld 09/18/2014 93  70 - 99 mg/dL Final  . BUN 09/18/2014 28* 6 - 23 mg/dL Final  . Creatinine, Ser 09/18/2014 1.11  0.50 - 1.35 mg/dL Final  . Calcium 09/18/2014 9.3  8.4 - 10.5 mg/dL Final  . Total Protein 09/18/2014 7.1  6.0 - 8.3 g/dL Final  . Albumin 09/18/2014 4.3  3.5 - 5.2 g/dL Final  . AST 09/18/2014 20  0 - 37 U/L Final  . ALT 09/18/2014 17  0 - 53 U/L Final  . Alkaline Phosphatase 09/18/2014 69  39 - 117 U/L Final  . Total Bilirubin 09/18/2014 0.4  0.3 - 1.2 mg/dL Final  . GFR calc non Af Amer 09/18/2014 67* >90 mL/min Final  . GFR calc Af Amer 09/18/2014 78* >90 mL/min Final   Comment: (NOTE) The eGFR has been calculated using the CKD EPI equation. This calculation has not been validated in all clinical situations. eGFR's persistently <90 mL/min signify possible Chronic Kidney Disease.   . Anion gap 09/18/2014 8  5 - 15 Final  . Prothrombin Time 09/18/2014 13.2  11.6 - 15.2 seconds Final  . INR 09/18/2014 0.99  0.00 - 1.49 Final  . Color, Urine 09/18/2014 YELLOW  YELLOW Final  . APPearance 09/18/2014 CLOUDY* CLEAR Final  . Specific Gravity, Urine 09/18/2014 1.021  1.005 - 1.030 Final  . pH 09/18/2014 7.0  5.0 - 8.0 Final  . Glucose, UA 09/18/2014 NEGATIVE  NEGATIVE mg/dL Final  . Hgb urine dipstick 09/18/2014 NEGATIVE  NEGATIVE Final  . Bilirubin Urine 09/18/2014 NEGATIVE  NEGATIVE Final  . Ketones, ur  09/18/2014 NEGATIVE  NEGATIVE mg/dL Final  . Protein, ur 09/18/2014 NEGATIVE  NEGATIVE mg/dL Final  . Urobilinogen, UA 09/18/2014 0.2  0.0 - 1.0 mg/dL Final  . Nitrite 09/18/2014 NEGATIVE  NEGATIVE Final  . Leukocytes, UA 09/18/2014 TRACE* NEGATIVE Final  .  MRSA, PCR 09/18/2014 NEGATIVE  NEGATIVE Final  . Staphylococcus aureus 09/18/2014 NEGATIVE  NEGATIVE Final   Comment:        The Xpert SA Assay (FDA approved for NASAL specimens in patients over 65 years of age), is one component of a comprehensive surveillance program.  Test performance has been validated by [facility] for patients greater than or equal to 52 year old. It is not intended to diagnose infection nor to guide or monitor treatment.   . Squamous Epithelial / LPF 09/18/2014 RARE  RARE Final  . WBC, UA 09/18/2014 21-50  <3 WBC/hpf Final  . RBC / HPF 09/18/2014 0-2  <3 RBC/hpf Final     X-Rays:Dg Pelvis Portable  09/25/2014   CLINICAL DATA:  Postop anterior left hip  EXAM: PORTABLE PELVIS 1-2 VIEWS  COMPARISON:  05/08/2014  FINDINGS: There is a total left hip arthroplasty with components well aligned. Soft tissue drain noted. No fracture or other immediate complication is evident.  IMPRESSION: Left total hip arthroplasty   Electronically Signed   By: Andreas Newport M.D.   On: 09/25/2014 14:12   Dg C-arm 1-60 Min-no Report  09/27/2014   : Fluoroscopy was utilized by the requesting physician. No radiographic interpretation.   Electronically Signed   By: Porfirio Mylar   On: 09/27/2014 08:22    EKG: Orders placed or performed during the hospital encounter of 04/29/14  . EKG 12-Lead  . EKG 12-Lead     Hospital Course: Patient was admitted to [facility] and taken to the OR and underwent the above state procedure without complications.  Patient tolerated the procedure well and was later transferred to the recovery room and then to the orthopaedic floor for postoperative care.  They were given PO and IV  analgesics for pain control following their surgery.  They were given 24 hours of postoperative antibiotics of  Anti-infectives    Start     Dose/Rate Route Frequency Ordered Stop   09/25/14 1800  ceFAZolin (ANCEF) IVPB 2 g/50 mL premix     2 g100 mL/hr over 30 Minutes Intravenous Every 6 hours 09/25/14 1530 09/26/14 0559   09/25/14 1004  ceFAZolin (ANCEF) IVPB 2 g/50 mL premix     2 g100 mL/hr over 30 Minutes Intravenous On call to O.R. 09/25/14 1004 09/25/14 1155     and started on DVT prophylaxis in the form of Xarelto.   PT and OT were ordered for total hip protocol.  The patient was allowed to be WBAT with therapy. Discharge planning was consulted to help with postop disposition and equipment needs.  Patient had a rough night on the evening of surgery with moderate to severe pain noted.  They were doing a little better the next morning and started to get up OOB with therapy on day one.  Hemovac drain was pulled without difficulty.  Continued to work with therapy into day two.  Dressing was changed on day two and the incision was healing well. Patient was seen in rounds on day two and was ready to go home.  Discharge home with home health Diet - Regular diet Follow up - in 2 weeks Activity - WBAT Disposition - Home Condition Upon Discharge - Good D/C Meds - See DC Summary DVT Prophylaxis - Xarelto      Discharge Instructions    Call MD / Call 911    Complete by:  As directed   If you experience chest pain or shortness of breath, CALL 911  and be transported to the hospital emergency room.  If you develope a fever above 101 F, pus (white drainage) or increased drainage or redness at the wound, or calf pain, call your surgeon's office.     Change dressing    Complete by:  As directed   You may change your dressing dressing daily with sterile 4 x 4 inch gauze dressing and paper tape.  Do not submerge the incision under water.     Constipation Prevention    Complete by:  As directed    Drink plenty of fluids.  Prune juice may be helpful.  You may use a stool softener, such as Colace (over the counter) 100 mg twice a day.  Use MiraLax (over the counter) for constipation as needed.     Diet - low sodium heart healthy    Complete by:  As directed      Discharge instructions    Complete by:  As directed   Pick up stool softner and laxative for home use following surgery while on pain medications. Do not submerge incision under water. Please use good hand washing techniques while changing dressing each day. May shower starting three days after surgery. Please use a clean towel to pat the incision dry following showers. Continue to use ice for pain and swelling after surgery. Do not use any lotions or creams on the incision until instructed by your surgeon.   Total Hip Protocol.  Take Xarelto for two and a half more weeks, then discontinue Xarelto. Once the patient has completed the blood thinner regimen, then take a Baby 81 mg Aspirin daily for three more weeks.  Postoperative Constipation Protocol  Constipation - defined medically as fewer than three stools per week and severe constipation as less than one stool per week.  One of the most common issues patients have following surgery is constipation.  Even if you have a regular bowel pattern at home, your normal regimen is likely to be disrupted due to multiple reasons following surgery.  Combination of anesthesia, postoperative narcotics, change in appetite and fluid intake all can affect your bowels.  In order to avoid complications following surgery, here are some recommendations in order to help you during your recovery period.  Colace (docusate) - Pick up an over-the-counter form of Colace or another stool softener and take twice a day as long as you are requiring postoperative pain medications.  Take with a full glass of water daily.  If you experience loose stools or diarrhea, hold the colace until you stool forms back  up.  If your symptoms do not get better within 1 week or if they get worse, check with your doctor.  Dulcolax (bisacodyl) - Pick up over-the-counter and take as directed by the product packaging as needed to assist with the movement of your bowels.  Take with a full glass of water.  Use this product as needed if not relieved by Colace only.   MiraLax (polyethylene glycol) - Pick up over-the-counter to have on hand.  MiraLax is a solution that will increase the amount of water in your bowels to assist with bowel movements.  Take as directed and can mix with a glass of water, juice, soda, coffee, or tea.  Take if you go more than two days without a movement. Do not use MiraLax more than once per day. Call your doctor if you are still constipated or irregular after using this medication for 7 days in a row.  If you continue  to have problems with postoperative constipation, please contact the office for further assistance and recommendations.  If you experience "the worst abdominal pain ever" or develop nausea or vomiting, please contact the office immediatly for further recommendations for treatment.     Do not sit on low chairs, stoools or toilet seats, as it may be difficult to get up from low surfaces    Complete by:  As directed      Driving restrictions    Complete by:  As directed   No driving until released by the physician.     Increase activity slowly as tolerated    Complete by:  As directed      Lifting restrictions    Complete by:  As directed   No lifting until released by the physician.     Patient may shower    Complete by:  As directed   You may shower without a dressing once there is no drainage.  Do not wash over the wound.  If drainage remains, do not shower until drainage stops.     TED hose    Complete by:  As directed   Use stockings (TED hose) for 3 weeks on both leg(s).  You may remove them at night for sleeping.     Weight bearing as tolerated    Complete by:  As  directed   Laterality:  left  Extremity:  Lower            Medication List    STOP taking these medications        cholecalciferol 1000 UNITS tablet  Commonly known as:  VITAMIN D     denosumab 120 MG/1.7ML Soln injection  Commonly known as:  XGEVA     multivitamin with minerals Tabs tablet     OSTEO BI-FLEX JOINT SHIELD PO     oyster calcium 500 MG Tabs tablet     VITAMIN B-12 PO      TAKE these medications        lisinopril 10 MG tablet  Commonly known as:  PRINIVIL,ZESTRIL  Take 10 mg by mouth every morning.     LUPRON DEPOT IM  Inject into the muscle every 6 (six) months.     methocarbamol 500 MG tablet  Commonly known as:  ROBAXIN  Take 1 tablet (500 mg total) by mouth every 6 (six) hours as needed for muscle spasms.     oxyCODONE 5 MG immediate release tablet  Commonly known as:  Oxy IR/ROXICODONE  Take 1-3 tablets (5-15 mg total) by mouth every 3 (three) hours as needed for moderate pain, severe pain or breakthrough pain.     polyvinyl alcohol 1.4 % ophthalmic solution  Commonly known as:  LIQUIFILM TEARS  Place 1 drop into both eyes 3 (three) times daily as needed for dry eyes.     PROVENGE IV  Inject into the vein.     rivaroxaban 10 MG Tabs tablet  Commonly known as:  XARELTO  - Take 1 tablet (10 mg total) by mouth daily with breakfast. Take Xarelto for two and a half more weeks, then discontinue Xarelto.  - Once the patient has completed the blood thinner regimen, then take a Baby 81 mg Aspirin daily for three more weeks.     traMADol 50 MG tablet  Commonly known as:  ULTRAM  Take 1-2 tablets (50-100 mg total) by mouth every 6 (six) hours as needed (mild pain).       Follow-up Information  Follow up with [facility].   Why:  home health physical therapy   Contact information:   [address] [place] [address] [phone]       Follow up with Gearlean Alf, MD In 2 weeks.   Specialty:  Orthopedic Surgery     Why:  Call office at [phone] to set up appointment with Dr. Wynelle Link for two weeks.   Contact information:   [address] [place] [address] [phone]       Signed: Arlee Muslim, PA-C Orthopaedic Surgery 10/01/2014, 11:08 AM

## 2014-09-26 NOTE — Discharge Instructions (Addendum)
Dr. Gaynelle Arabian Total Joint Specialist [facility] [address] [place],  [address] [phone]    ANTERIOR APPROACH TOTAL HIP REPLACEMENT POSTOPERATIVE DIRECTIONS   Hip Rehabilitation, Guidelines Following Surgery  The results of a hip operation are greatly improved after range of motion and muscle strengthening exercises. Follow all safety measures which are given to protect your hip. If any of these exercises cause increased pain or swelling in your joint, decrease the amount until you are comfortable again. Then slowly increase the exercises. Call your caregiver if you have problems or questions.  HOME CARE INSTRUCTIONS  Most of the following instructions are designed to prevent the dislocation of your new hip.  Remove items at home which could result in a fall. This includes throw rugs or furniture in walking pathways.  Continue medications as instructed at time of discharge.  You may have some home medications which will be placed on hold until you complete the course of blood thinner medication.  You may start showering once you are discharged home but do not submerge the incision under water. Just pat the incision dry and apply a dry gauze dressing on daily. Do not put on socks or shoes without following the instructions of your caregivers.  Sit on high chairs which makes it easier to stand.  Sit on chairs with arms. Use the chair arms to help push yourself up when arising.  Keep your leg on the side of the operation out in front of you when standing up.  Arrange for the use of a toilet seat elevator so you are not sitting low.    Walk with walker as instructed.  You may resume a sexual relationship in one month or when given the OK by your caregiver.  Use walker as long as suggested by your caregivers.  You may put full weight on your legs and walk as much as is comfortable. Avoid periods of inactivity such as sitting longer than an hour  when not asleep. This helps prevent blood clots.  You may return to work once you are cleared by Engineer, production.  Do not drive a car for 6 weeks or until released by your surgeon.  Do not drive while taking narcotics.  Wear elastic stockings for three weeks following surgery during the day but you may remove then at night.  Make sure you keep all of your appointments after your operation with all of your doctors and caregivers. You should call the office at the above phone number and make an appointment for approximately two weeks after the date of your surgery. Change the dressing daily and reapply a dry dressing each time. Please pick up a stool softener and laxative for home use as long as you are requiring pain medications.  ICE to the affected hip every three hours for 30 minutes at a time and then as needed for pain and swelling.  Continue to use ice on the hip for pain and swelling from surgery. You may notice swelling that will progress down to the foot and ankle.  This is normal after surgery.  Elevate the leg when you are not up walking on it.   It is important for you to complete the blood thinner medication as prescribed by your doctor.  Continue to use the breathing machine which will help keep your temperature down.  It is common for your temperature to cycle up and down following surgery, especially at night when you are not up moving around  and exerting yourself.  The breathing machine keeps your lungs expanded and your temperature down. ° °RANGE OF MOTION AND STRENGTHENING EXERCISES  °These exercises are designed to help you keep full movement of your hip joint. Follow your caregiver's or physical therapist's instructions. Perform all exercises about fifteen times, three times per day or as directed. Exercise both hips, even if you have had only one joint replacement. These exercises can be done on a training (exercise) mat, on the floor, on a table or on a bed. Use whatever works the best  and is most comfortable for you. Use music or television while you are exercising so that the exercises are a pleasant break in your day. This will make your life better with the exercises acting as a break in routine you can look forward to.  °Lying on your back, slowly slide your foot toward your buttocks, raising your knee up off the floor. Then slowly slide your foot back down until your leg is straight again.  °Lying on your back spread your legs as far apart as you can without causing discomfort.  °Lying on your side, raise your upper leg and foot straight up from the floor as far as is comfortable. Slowly lower the leg and repeat.  °Lying on your back, tighten up the muscle in the front of your thigh (quadriceps muscles). You can do this by keeping your leg straight and trying to raise your heel off the floor. This helps strengthen the largest muscle supporting your knee.  °Lying on your back, tighten up the muscles of your buttocks both with the legs straight and with the knee bent at a comfortable angle while keeping your heel on the floor.  ° °SKILLED REHAB INSTRUCTIONS: °If the patient is transferred to a skilled rehab facility following release from the hospital, a list of the current medications will be sent to the facility for the patient to continue.  When discharged from the skilled rehab facility, please have the facility set up the patient's Home Health Physical Therapy prior to being released. Also, the skilled facility will be responsible for providing the patient with their medications at time of release from the facility to include their pain medication, the muscle relaxants, and their blood thinner medication. If the patient is still at the rehab facility at time of the two week follow up appointment, the skilled rehab facility will also need to assist the patient in arranging follow up appointment in our office and any transportation needs. ° °MAKE SURE YOU:  °Understand these instructions.    °Will watch your condition.  °Will get help right away if you are not doing well or get worse. ° °Pick up stool softner and laxative for home use following surgery while on pain medications. °Do not submerge incision under water. °Please use good hand washing techniques while changing dressing each day. °May shower starting three days after surgery. °Please use a clean towel to pat the incision dry following showers. °Continue to use ice for pain and swelling after surgery. °Do not use any lotions or creams on the incision until instructed by your surgeon. °Total Hip Protocol. ° °Take Xarelto for two and a half more weeks, then discontinue Xarelto. °Once the patient has completed the blood thinner regimen, then take a Baby 81 mg Aspirin daily for three more weeks. ° ° °Postoperative Constipation Protocol ° °Constipation - defined medically as fewer than three stools per week and severe constipation as less than one stool per week. ° °  One of the most common issues patients have following surgery is constipation.  Even if you have a regular bowel pattern at home, your normal regimen is likely to be disrupted due to multiple reasons following surgery.  Combination of anesthesia, postoperative narcotics, change in appetite and fluid intake all can affect your bowels.  In order to avoid complications following surgery, here are some recommendations in order to help you during your recovery period.  Colace (docusate) - Pick up an over-the-counter form of Colace or another stool softener and take twice a day as long as you are requiring postoperative pain medications.  Take with a full glass of water daily.  If you experience loose stools or diarrhea, hold the colace until you stool forms back up.  If your symptoms do not get better within 1 week or if they get worse, check with your doctor.  Dulcolax (bisacodyl) - Pick up over-the-counter and take as directed by the product packaging as needed to assist with the  movement of your bowels.  Take with a full glass of water.  Use this product as needed if not relieved by Colace only.   MiraLax (polyethylene glycol) - Pick up over-the-counter to have on hand.  MiraLax is a solution that will increase the amount of water in your bowels to assist with bowel movements.  Take as directed and can mix with a glass of water, juice, soda, coffee, or tea.  Take if you go more than two days without a movement. Do not use MiraLax more than once per day. Call your doctor if you are still constipated or irregular after using this medication for 7 days in a row.  If you continue to have problems with postoperative constipation, please contact the office for further assistance and recommendations.  If you experience "the worst abdominal pain ever" or develop nausea or vomiting, please contact the office immediatly for further recommendations for treatment.    Information on my medicine - XARELTO (Rivaroxaban)  This medication education was reviewed with me or my healthcare representative as part of my discharge preparation.  The pharmacist that spoke with me during my hospital stay was:  Absher, Julieta Bellini, RPH  Why was Xarelto prescribed for you? Xarelto was prescribed for you to reduce the risk of blood clots forming after orthopedic surgery. The medical term for these abnormal blood clots is venous thromboembolism (VTE).  What do you need to know about xarelto ? Take your Xarelto ONCE DAILY at the same time every day. You may take it either with or without food.  If you have difficulty swallowing the tablet whole, you may crush it and mix in applesauce just prior to taking your dose.  Take Xarelto exactly as prescribed by your doctor and DO NOT stop taking Xarelto without talking to the doctor who prescribed the medication.  Stopping without other VTE prevention medication to take the place of Xarelto may increase your risk of developing a clot.  After  discharge, you should have regular check-up appointments with your healthcare provider that is prescribing your Xarelto.    What do you do if you miss a dose? If you miss a dose, take it as soon as you remember on the same day then continue your regularly scheduled once daily regimen the next day. Do not take two doses of Xarelto on the same day.   Important Safety Information A possible side effect of Xarelto is bleeding. You should call your healthcare provider right away if you  experience any of the following: ? Bleeding from an injury or your nose that does not stop. ? Unusual colored urine (red or dark brown) or unusual colored stools (red or black). ? Unusual bruising for unknown reasons. ? A serious fall or if you hit your head (even if there is no bleeding).  Some medicines may interact with Xarelto and might increase your risk of bleeding while on Xarelto. To help avoid this, consult your healthcare provider or pharmacist prior to using any new prescription or non-prescription medications, including herbals, vitamins, non-steroidal anti-inflammatory drugs (NSAIDs) and supplements.  This website has more information on Xarelto: [email or web address]

## 2014-09-26 NOTE — Progress Notes (Signed)
Physical Therapy Treatment Patient Details Name: Kevin Stafford MRN: [id number] DOB: [birth year] Today's Date: October 01, 2014    History of Present Illness L THR; s/p R THR 9/15    PT Comments    Pt progressing well and eager for dc tomorrow  Follow Up Recommendations  Home health PT     Equipment Recommendations  None recommended by PT    Recommendations for Other Services OT consult     Precautions / Restrictions Precautions Precautions: Fall Restrictions Weight Bearing Restrictions: No Other Position/Activity Restrictions: WBAT    Mobility  Bed Mobility                  Transfers Overall transfer level: Needs assistance Equipment used: Rolling walker (2 wheeled) Transfers: Sit to/from Stand Sit to Stand: Min guard         General transfer comment: cues for LE management and use of UEs to self assist  Ambulation/Gait Ambulation/Gait assistance: Min guard;Supervision Ambulation Distance (Feet): 450 Feet Assistive device: Rolling walker (2 wheeled) Gait Pattern/deviations: Step-to pattern;Step-through pattern;Shuffle;Trunk flexed Gait velocity: mod pace   General Gait Details: cues for posture, position from RW, ER on L and initial sequence   Stairs Stairs: Yes Stairs assistance: Min assist Stair Management: One rail Right;Step to pattern;Forwards;With cane Number of Stairs: 4 General stair comments: cues for sequence and foot/cane placment  Wheelchair Mobility    Modified Rankin (Stroke Patients Only)       Balance                                    Cognition Arousal/Alertness: Awake/alert Behavior During Therapy: WFL for tasks assessed/performed Overall Cognitive Status: Within Functional Limits for tasks assessed                      Exercises      General Comments        Pertinent Vitals/Pain Pain Assessment: 0-10 Pain Score: 2  Pain Location: L hip Pain Descriptors / Indicators: Aching Pain  Intervention(s): Limited activity within patient's tolerance;Monitored during session;Premedicated before session;Ice applied    Home Living                      Prior Function            PT Goals (current goals can now be found in the care plan section) Acute Rehab PT Goals Patient Stated Goal: Resume previous active lifestyle with decreased pain PT Goal Formulation: With patient Time For Goal Achievement: 10/03/14 Potential to Achieve Goals: Good Progress towards PT goals: Progressing toward goals    Frequency  7X/week    PT Plan Current plan remains appropriate    Co-evaluation             End of Session Equipment Utilized During Treatment: Gait belt Activity Tolerance: Patient tolerated treatment well Patient left: in chair;with call bell/phone within reach     Time: 1400-1418 PT Time Calculation (min) (ACUTE ONLY): 18 min  Charges:  $Gait Training: 8-22 mins                    G Codes:      Arisbeth Purrington 10/01/14, 3:30 PM

## 2014-09-26 NOTE — Progress Notes (Signed)
   Subjective: 1 Day Post-Op Procedure(s) (LRB): LEFT TOTAL HIP ARTHROPLASTY ANTERIOR APPROACH (Left) Patient reports pain as moderate and severe last night but was able to get it under control this morning. Patient seen in rounds with Dr. Wynelle Link. Patient is well, but has had some minor complaints of pain in the hip and groin, requiring pain medications We will start therapy today.  Plan is to go Home after hospital stay.  Objective: Vital signs in last 24 hours: Temp:  [97.6 F (36.4 C)-98.8 F (37.1 C)] 98.1 F (36.7 C) (01/28 0434) Pulse Rate:  [73-93] 74 (01/28 0434) Resp:  [11-19] 16 (01/28 0434) BP: (118-134)/(69-86) 124/76 mmHg (01/28 0434) SpO2:  [92 %-99 %] 96 % (01/28 0434) Weight:  [106.595 kg (235 lb)] 106.595 kg (235 lb) (01/27 1030)  Intake/Output from previous day:  Intake/Output Summary (Last 24 hours) at 09/26/14 0816 Last data filed at 09/26/14 0600  Gross per 24 hour  Intake 3853.33 ml  Output   2400 ml  Net 1453.33 ml    Labs:  Recent Labs  09/26/14 0515  HGB 11.9*    Recent Labs  09/26/14 0515  WBC 11.4*  RBC 4.43  HCT 38.1*  PLT 254    Recent Labs  09/26/14 0515  NA 139  K 4.3  CL 109  CO2 20  BUN 19  CREATININE 0.82  GLUCOSE 123*  CALCIUM 7.9*   No results for input(s): LABPT, INR in the last 72 hours.  EXAM General - Patient is Alert, Appropriate and Oriented Extremity - Neurovascular intact Sensation intact distally Dorsiflexion/Plantar flexion intact Dressing - dressing C/D/I Motor Function - intact, moving foot and toes well on exam.  Hemovac pulled without difficulty.  Past Medical History  Diagnosis Date  . Nocturia   . Recurrent UTI   . Prostate cancer S/P RADIOACTIVE SEED IMPLANTS 2008 AND CRYOABLATION OF PROSTATE 06-222012  . Bladder retention of urine     foley since  09/27/11  . Osteomyelitis of pelvis   . Toxicity, radiation     "radiation in 2008-too much, bladder shrunk and prostate exploded"2012 s/s  started so [facility] to [facility]  . Hypertension   . History of kidney stones   . Arthritis   . History of urostomy     Assessment/Plan: 1 Day Post-Op Procedure(s) (LRB): LEFT TOTAL HIP ARTHROPLASTY ANTERIOR APPROACH (Left) Active Problems:   OA (osteoarthritis) of hip  Estimated body mass index is 29.37 kg/(m^2) as calculated from the following:   Height as of this encounter: 6\' 3"  (1.905 m).   Weight as of this encounter: 106.595 kg (235 lb). Advance diet Up with therapy Plan for discharge tomorrow Discharge home with home health  DVT Prophylaxis - Xarelto Weight Bearing As Tolerated left Leg Hemovac Pulled Begin Therapy  Arlee Muslim, PA-C Orthopaedic Surgery 09/26/2014, 8:16 AM

## 2014-09-26 NOTE — Care Management Note (Signed)
    Page 1 of 2   09/26/2014     2:36:03 PM CARE MANAGEMENT NOTE 09/26/2014  Patient:  Kevin Stafford, Kevin Stafford   Account Number:  [id number]  Date Initiated:  09/26/2014  Documentation initiated by:  [facility]  Subjective/Objective Assessment:   adm: CENTRAL DECOMPRESSION L3-4,L4-L5 WITH MICRODISCECTOMY L3-4 L4-5 ON LEFT, FORAMINOTOMY of L3-4 GROUP BILATERALLY (N/A)     Action/Plan:   discharge plannning   Anticipated DC Date:  09/27/2014   Anticipated DC Plan:  [place]  CM consult      [facility] Choice  HOME HEALTH   Choice offered to / List presented to:  C-1 Patient   DME arranged  NA      DME agency  NA     [place] arranged  HH-2 PT      [place]   Status of service:  Completed, signed off Medicare Important Message given?   (If response is "NO", the following Medicare IM given date fields will be blank) Date Medicare IM given:   Medicare IM given by:   Date Additional Medicare IM given:   Additional Medicare IM given by:    Discharge Disposition:  [place]  Per UR Regulation:    If discussed at Long Length of Stay Meetings, dates discussed:    Comments:  09/26/14 14:00 CM met with pt in room to offer choice of home health agency.  Pt chooses [facility] to render HHPT.  Pt has both rolling walker and 3n1 from previous surgery. Address and contact information verified by pt.  Referral called to [facility].  No other CM needs were communicated.  Mariane Masters, BSN, CM [phone].

## 2014-09-26 NOTE — Progress Notes (Signed)
Drsg chnged to right central line;heparin withdrawn   To both port and  New heparin instilled ;1.6 to red port;1.8 to blue port;with good blood return.

## 2014-09-26 NOTE — Progress Notes (Signed)
OT Cancellation Note  Patient Details Name: Kevin Stafford MRN: [id number] DOB: 07/21/1946   Cancelled Treatment:    Reason Eval/Treat Not Completed: OT screened, no needs identified, will sign off. Pt familiar from his previous hip surgery this past Sept and has all DME. Wife will be assisting. Pt declines OT needs.  [place], [place] 09/26/2014, 12:28 PM

## 2014-09-27 LAB — BASIC METABOLIC PANEL
ANION GAP: 7 (ref 5–15)
BUN: 21 mg/dL (ref 6–23)
CALCIUM: 8.3 mg/dL — AB (ref 8.4–10.5)
CHLORIDE: 107 mmol/L (ref 96–112)
CO2: 26 mmol/L (ref 19–32)
Creatinine, Ser: 0.83 mg/dL (ref 0.50–1.35)
GFR calc non Af Amer: 90 mL/min — ABNORMAL LOW (ref >90)
GLUCOSE: 105 mg/dL — AB (ref 70–99)
Potassium: 5.2 mmol/L — ABNORMAL HIGH (ref 3.5–5.1)
SODIUM: 140 mmol/L (ref 135–145)

## 2014-09-27 LAB — CBC
HEMATOCRIT: 36.8 % — AB (ref 39.0–52.0)
Hemoglobin: 11.7 g/dL — ABNORMAL LOW (ref 13.0–17.0)
MCH: 27.3 pg (ref 26.0–34.0)
MCHC: 31.8 g/dL (ref 30.0–36.0)
MCV: 85.8 fL (ref 78.0–100.0)
Platelets: 202 10*3/uL (ref 150–400)
RBC: 4.29 MIL/uL (ref 4.22–5.81)
RDW: 15.9 % — ABNORMAL HIGH (ref 11.5–15.5)
WBC: 10 10*3/uL (ref 4.0–10.5)

## 2014-09-27 NOTE — Progress Notes (Signed)
   Subjective: 2 Days Post-Op Procedure(s) (LRB): LEFT TOTAL HIP ARTHROPLASTY ANTERIOR APPROACH (Left) Patient reports pain as mild.   Patient seen in rounds with Dr. Wynelle Link. Patient is well, and has had no acute complaints or problems Patient is ready to go home  Objective: Vital signs in last 24 hours: Temp:  [98.5 F (36.9 C)-99.3 F (37.4 C)] 98.7 F (37.1 C) (01/29 0625) Pulse Rate:  [70-74] 74 (01/29 0625) Resp:  [16-20] 16 (01/29 0625) BP: (119-132)/(79-82) 123/79 mmHg (01/29 0625) SpO2:  [92 %-96 %] 95 % (01/29 0625)  Intake/Output from previous day:  Intake/Output Summary (Last 24 hours) at 09/27/14 0922 Last data filed at 09/27/14 0300  Gross per 24 hour  Intake    480 ml  Output   2425 ml  Net  -1945 ml     Labs:  Recent Labs  09/26/14 0515 09/27/14 0542  HGB 11.9* 11.7*    Recent Labs  09/26/14 0515 09/27/14 0542  WBC 11.4* 10.0  RBC 4.43 4.29  HCT 38.1* 36.8*  PLT 254 202    Recent Labs  09/26/14 0515 09/27/14 0542  NA 139 140  K 4.3 5.2*  CL 109 107  CO2 20 26  BUN 19 21  CREATININE 0.82 0.83  GLUCOSE 123* 105*  CALCIUM 7.9* 8.3*   No results for input(s): LABPT, INR in the last 72 hours.  EXAM: General - Patient is Alert, Appropriate and Oriented Extremity - Neurovascular intact Sensation intact distally Dorsiflexion/Plantar flexion intact Incision - clean, dry, no drainage Motor Function - intact, moving foot and toes well on exam.   Assessment/Plan: 2 Days Post-Op Procedure(s) (LRB): LEFT TOTAL HIP ARTHROPLASTY ANTERIOR APPROACH (Left) Procedure(s) (LRB): LEFT TOTAL HIP ARTHROPLASTY ANTERIOR APPROACH (Left) Past Medical History  Diagnosis Date  . Nocturia   . Recurrent UTI   . Prostate cancer S/P RADIOACTIVE SEED IMPLANTS 2008 AND CRYOABLATION OF PROSTATE 06-222012  . Bladder retention of urine     foley since  09/27/11  . Osteomyelitis of pelvis   . Toxicity, radiation     "radiation in 2008-too much, bladder  shrunk and prostate exploded"2012 s/s started so went to [facility]  . Hypertension   . History of kidney stones   . Arthritis   . History of urostomy    Active Problems:   OA (osteoarthritis) of hip  Estimated body mass index is 29.37 kg/(m^2) as calculated from the following:   Height as of this encounter: 6\' 3"  (1.905 m).   Weight as of this encounter: 106.595 kg (235 lb). Up with therapy Discharge home with home health Diet - Regular diet Follow up - in 2 weeks Activity - WBAT Disposition - Home Condition Upon Discharge - Good D/C Meds - See DC Summary DVT Prophylaxis - Xarelto  Arlee Muslim, PA-C Orthopaedic Surgery 09/27/2014, 9:22 AM

## 2014-09-27 NOTE — Progress Notes (Signed)
Physical Therapy Treatment Patient Details Name: Kevin Stafford MRN: [id number] DOB: 16-Jun-1946 Today's Date: 09/27/2014    History of Present Illness L THR; s/p R THR 9/15    PT Comments    Pt eager to D/C to home.  Assisted OOB to amb a great distance in hallway then returned to room to perform THR TE's following HEP handout.  Follow Up Recommendations  Home health PT     Equipment Recommendations       Recommendations for Other Services       Precautions / Restrictions Precautions Precautions: None Restrictions Weight Bearing Restrictions: No    Mobility  Bed Mobility Overal bed mobility: Needs Assistance Bed Mobility: Supine to Sit     Supine to sit: Modified independent (Device/Increase time)     General bed mobility comments: demonstrated and instructed pt how to use a belt to assist L LE on/off bed  Transfers Overall transfer level: Needs assistance Equipment used: Rolling walker (2 wheeled) Transfers: Sit to/from Stand Sit to Stand: Modified independent (Device/Increase time)         General transfer comment: good safety cognition  Ambulation/Gait Ambulation/Gait assistance: Modified independent (Device/Increase time);Supervision Ambulation Distance (Feet): 455 Feet Assistive device: Rolling walker (2 wheeled) Gait Pattern/deviations: Step-through pattern Gait velocity: WFL       Stairs            Wheelchair Mobility    Modified Rankin (Stroke Patients Only)       Balance                                    Cognition                            Exercises   Total Hip Replacement TE's 10 reps ankle pumps 10 reps knee presses 10 reps heel slides 10 reps SAQ's 10 reps ABD Followed by ICE      General Comments        Pertinent Vitals/Pain Pain Assessment: 0-10 Pain Score: 3  Pain Location: L hip Pain Descriptors / Indicators: Tightness;Sore Pain Intervention(s): Monitored during  session;Premedicated before session;Repositioned;Ice applied    Home Living                      Prior Function            PT Goals (current goals can now be found in the care plan section) Progress towards PT goals: Progressing toward goals    Frequency  7X/week    PT Plan      Co-evaluation             End of Session Equipment Utilized During Treatment: Gait belt Activity Tolerance: Patient tolerated treatment well Patient left: in bed;with call bell/phone within reach     Time: 0920-0950 PT Time Calculation (min) (ACUTE ONLY): 30 min  Charges:  $Gait Training: 8-22 mins $Therapeutic Exercise: 8-22 mins                    G Codes:      Kevin Stafford  PTA [facility]  Acute  Rehab Pager      [phone]

## 2014-09-27 NOTE — Progress Notes (Signed)
RN reviewed discharge instructions with patient and family. All questions answered.  Paperwork and prescriptions given.   NT rolled patient down in wheelchair to family car.  

## 2016-07-09 IMAGING — CR DG HIP COMPLETE 2+V*R*
3 series · 3 of 3 positions shown · non-contrast
Comparison: 01/04/2011

CLINICAL DATA: Preop right total hip arthroplasty.

EXAM:
RIGHT HIP - COMPLETE 2+ VIEW

[t pelvis a.p.]
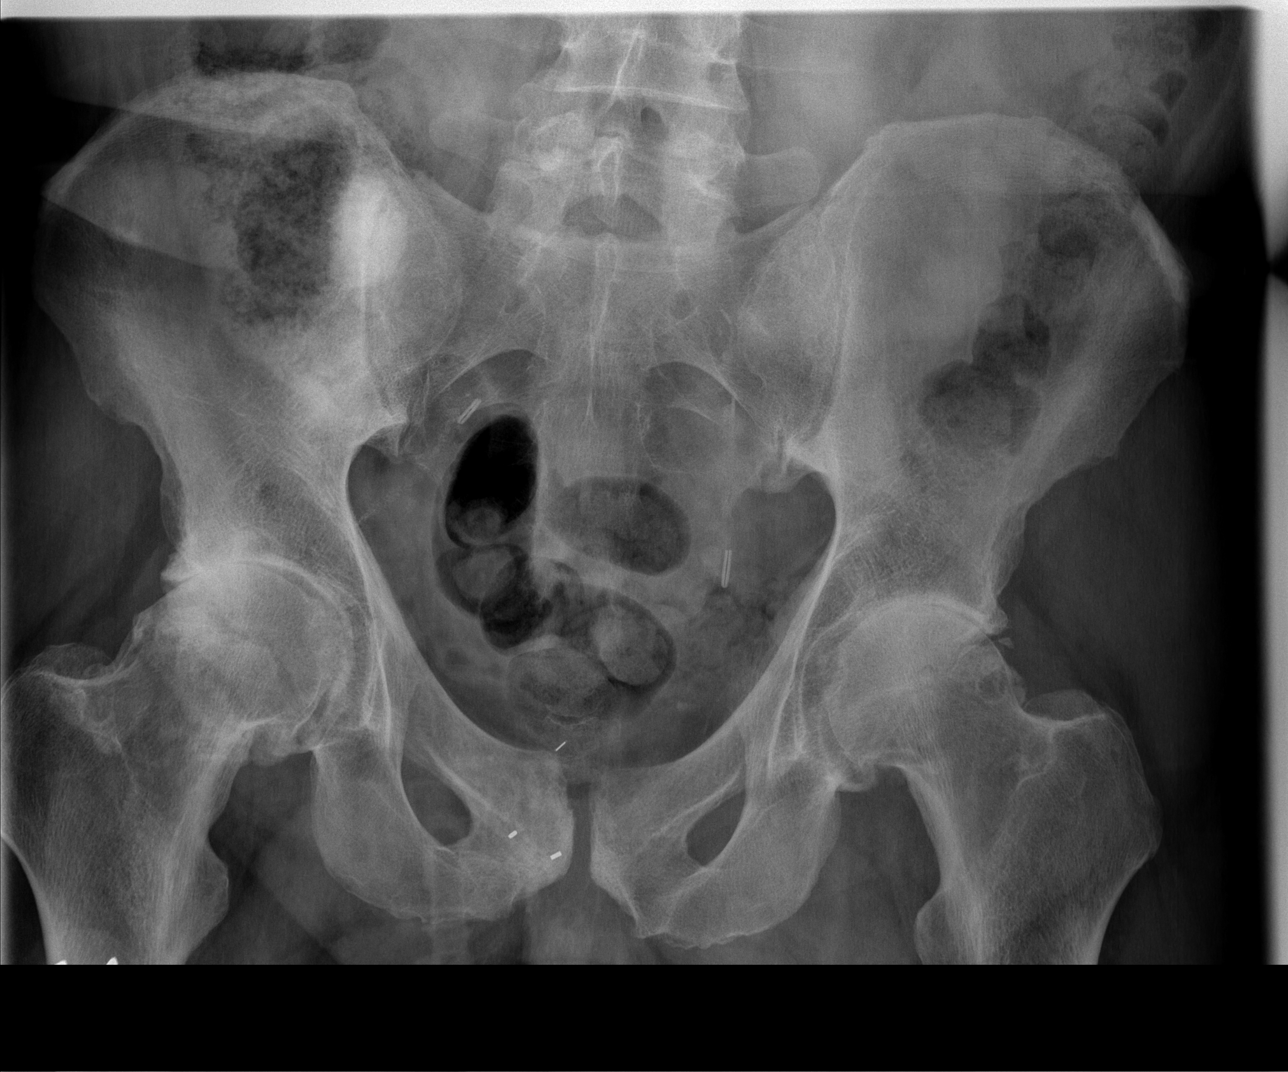

[t hip ap right]
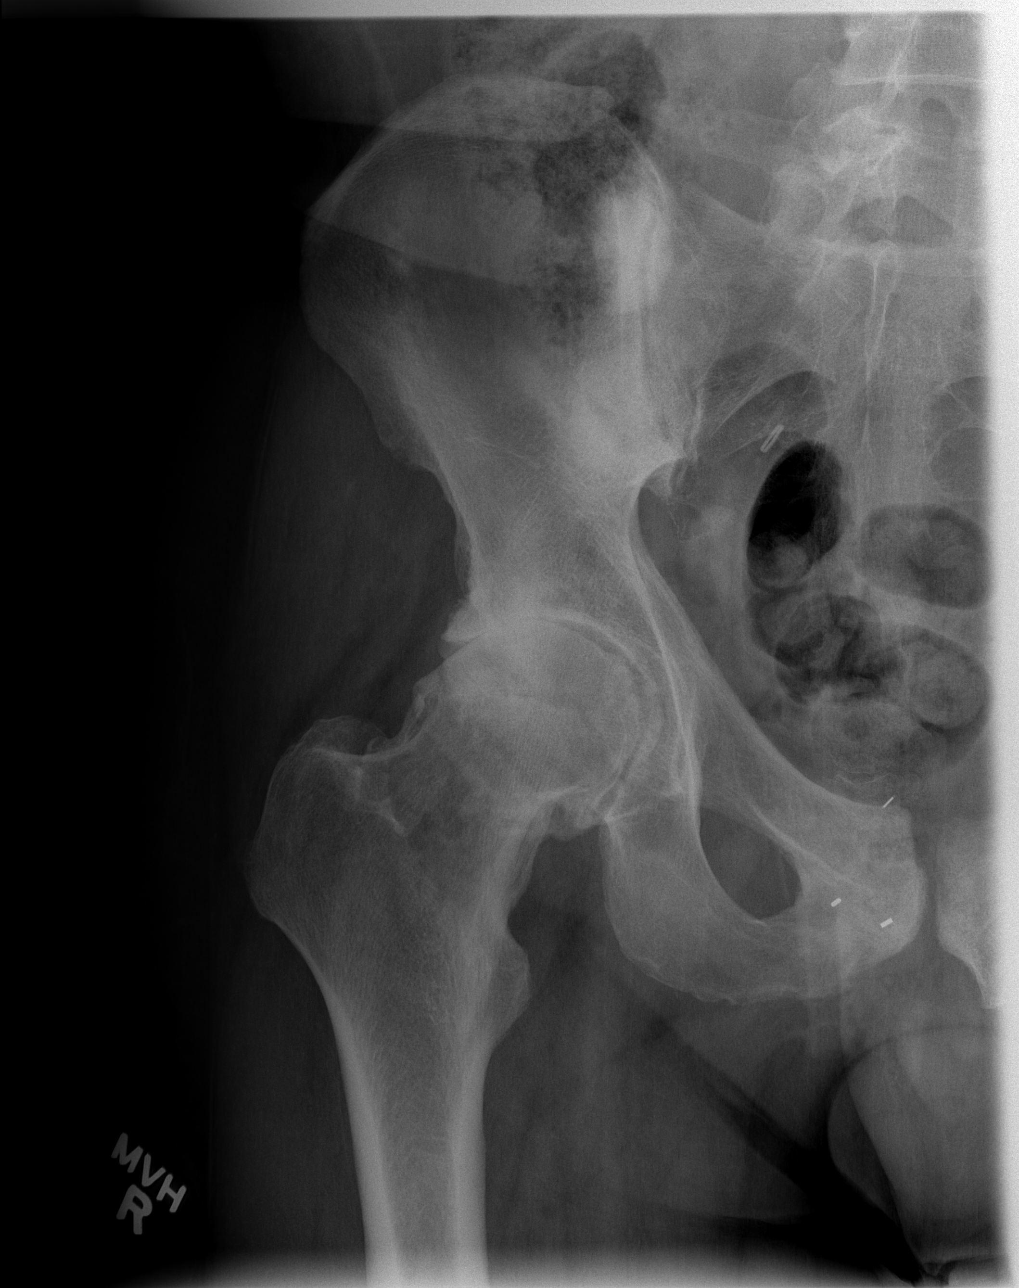

[t hip frog leg right]
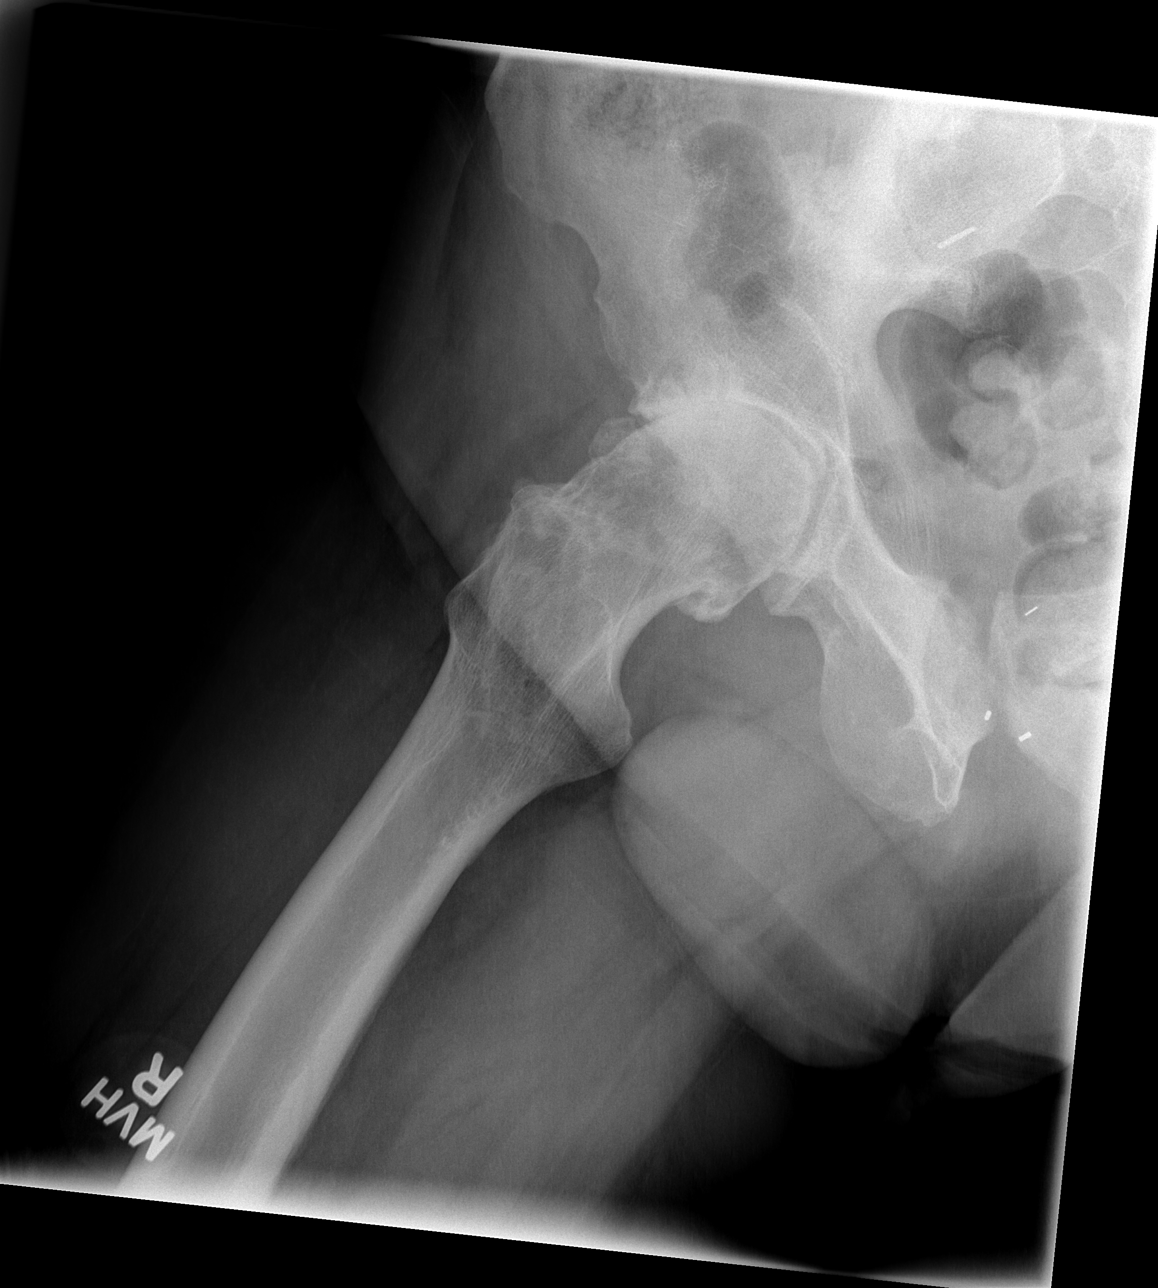

[3 of 3 positions shown; findings below may reference images not displayed]

FINDINGS: Exam demonstrates severe degenerative changes of the right hip with
interval progression. There are moderate degenerative changes of the
left hip with interval progression. Worsening focal sclerosis
measuring 3.8 cm over the right iliac bone adjacent the sacroiliac
joint with increased uptake on previous bone scan likely progression
of metastatic disease in this patient with known prostate cancer.
Slight increased sclerosis over the inferior aspect of the right
iliac bone adjacent the sacroiliac joint. There are surgical clips
over the pelvis. Remainder of the exam is unchanged.
IMPRESSION: Severe degenerative change of the right hip and moderate
degenerative change of the left hip with interval progression.

Worsening focal sclerosis over the superior right iliac bone
adjacent the sacroiliac joint likely progression of metastatic
disease in this patient with known prostate cancer. Slight worsening
sclerosis over the inferior aspect of the right iliac bone adjacent
sacroiliac joint.

## 2017-11-23 ENCOUNTER — Other Ambulatory Visit: Payer: Self-pay | Admitting: Urology

## 2017-11-23 DIAGNOSIS — Z79899 Other long term (current) drug therapy: Secondary | ICD-10-CM

## 2018-01-09 ENCOUNTER — Ambulatory Visit
Admission: RE | Admit: 2018-01-09 | Discharge: 2018-01-09 | Disposition: A | Discharge status: Home / Self Care | Payer: Medicare Other | Source: Ambulatory Visit | Attending: Urology | Admitting: Urology

## 2018-01-09 DIAGNOSIS — Z79899 Other long term (current) drug therapy: Secondary | ICD-10-CM

## 2022-12-23 ENCOUNTER — Ambulatory Visit (HOSPITAL_COMMUNITY): Payer: Self-pay | Admitting: Orthopedic Surgery

## 2022-12-24 ENCOUNTER — Other Ambulatory Visit: Payer: Self-pay

## 2022-12-24 ENCOUNTER — Encounter (HOSPITAL_COMMUNITY): Payer: Self-pay | Admitting: Orthopedic Surgery

## 2022-12-24 NOTE — Progress Notes (Signed)
SDW call  Patient was given pre-op instructions over the phone. Patient verbalized understanding of instructions provided.     PCP - Dr. Andrey Campanile, [facility] Cardiologist - Denies Pulmonary: Denies   PPM/ICD - Denies-    Chest x-ray - n/a EKG -  DOS, 12/27/2022 Stress Test - ECHO -  Cardiac Cath -   Sleep Study/sleep apnea/CPAP: Denies  Non-diabetic   Blood Thinner Instructions: n/a Aspirin Instructions:denies   ERAS Protcol - Yes, clear liquids until 0930 PRE-SURGERY Ensure or G2-    COVID TEST- n/a    Anesthesia review: No   Patient denies shortness of breath, fever, cough and chest pain over the phone call    Your procedure is scheduled on Monday December 27, 2022  Report to [facility] Main Entrance "A" at 1000 A.M., then check in with the Admitting office.  Call this number if you have problems the morning of surgery:  [phone]   If you have any questions prior to your surgery date call [phone]: Open Monday-Friday 8am-4pm If you experience any cold or flu symptoms such as cough, fever, chills, shortness of breath, etc. between now and your scheduled surgery, please notify us at the above number     Remember:  Do not eat after midnight the night before your surgery  You may drink clear liquids until  0930 the morning of your surgery.   Clear liquids allowed are: Water, Non-Citrus Juices (without pulp), Carbonated Beverages, Clear Tea, Black Coffee ONLY (NO MILK, CREAM OR POWDERED CREAMER of any kind), and Gatorade   Take these medicines the morning of surgery with A SIP OF WATER:  Atorvastatin  As of today, STOP taking any Aspirin (unless otherwise instructed by your surgeon) Aleve, Naproxen, Ibuprofen, Motrin, Advil, Goody's, BC's, all herbal medications, fish oil, and all vitamins.

## 2022-12-27 ENCOUNTER — Encounter (HOSPITAL_COMMUNITY): Payer: Self-pay | Admitting: Orthopedic Surgery

## 2022-12-27 ENCOUNTER — Encounter (HOSPITAL_COMMUNITY)
Admission: RE | Disposition: A | Discharge status: Home / Self Care | Payer: Self-pay | Source: Ambulatory Visit | Attending: Orthopedic Surgery

## 2022-12-27 ENCOUNTER — Ambulatory Visit (HOSPITAL_BASED_OUTPATIENT_CLINIC_OR_DEPARTMENT_OTHER): Payer: Medicare HMO | Admitting: Anesthesiology

## 2022-12-27 ENCOUNTER — Observation Stay (HOSPITAL_COMMUNITY)
Admission: RE | Admit: 2022-12-27 | Discharge: 2022-12-28 | Disposition: A | Discharge status: Home / Self Care | Payer: Medicare HMO | Source: Ambulatory Visit | Attending: Orthopedic Surgery | Admitting: Orthopedic Surgery

## 2022-12-27 ENCOUNTER — Ambulatory Visit (HOSPITAL_COMMUNITY): Payer: Medicare HMO

## 2022-12-27 ENCOUNTER — Ambulatory Visit (HOSPITAL_COMMUNITY): Payer: Medicare HMO | Admitting: Anesthesiology

## 2022-12-27 ENCOUNTER — Other Ambulatory Visit: Payer: Self-pay

## 2022-12-27 DIAGNOSIS — Z7901 Long term (current) use of anticoagulants: Secondary | ICD-10-CM | POA: Diagnosis not present

## 2022-12-27 DIAGNOSIS — M7138 Other bursal cyst, other site: Secondary | ICD-10-CM

## 2022-12-27 DIAGNOSIS — I10 Essential (primary) hypertension: Secondary | ICD-10-CM | POA: Insufficient documentation

## 2022-12-27 DIAGNOSIS — Z79899 Other long term (current) drug therapy: Secondary | ICD-10-CM | POA: Diagnosis not present

## 2022-12-27 DIAGNOSIS — M5416 Radiculopathy, lumbar region: Secondary | ICD-10-CM

## 2022-12-27 DIAGNOSIS — Z8546 Personal history of malignant neoplasm of prostate: Secondary | ICD-10-CM | POA: Insufficient documentation

## 2022-12-27 HISTORY — PX: MASS EXCISION: SHX2000

## 2022-12-27 LAB — CBC
HCT: 43.3 % (ref 39.0–52.0)
Hemoglobin: 13.9 g/dL (ref 13.0–17.0)
MCH: 29.3 pg (ref 26.0–34.0)
MCHC: 32.1 g/dL (ref 30.0–36.0)
MCV: 91.2 fL (ref 80.0–100.0)
Platelets: 211 10*3/uL (ref 150–400)
RBC: 4.75 MIL/uL (ref 4.22–5.81)
RDW: 14.4 % (ref 11.5–15.5)
WBC: 5.6 10*3/uL (ref 4.0–10.5)
nRBC: 0 % (ref 0.0–0.2)

## 2022-12-27 LAB — BASIC METABOLIC PANEL
Anion gap: 9 (ref 5–15)
BUN: 19 mg/dL (ref 8–23)
CO2: 20 mmol/L — ABNORMAL LOW (ref 22–32)
Calcium: 8.5 mg/dL — ABNORMAL LOW (ref 8.9–10.3)
Chloride: 107 mmol/L (ref 98–111)
Creatinine, Ser: 0.81 mg/dL (ref 0.61–1.24)
GFR, Estimated: >60 mL/min (ref >60)
Glucose, Bld: 99 mg/dL (ref 70–99)
Potassium: 4.4 mmol/L (ref 3.5–5.1)
Sodium: 136 mmol/L (ref 135–145)

## 2022-12-27 LAB — SURGICAL PCR SCREEN
MRSA, PCR: NEGATIVE
Staphylococcus aureus: POSITIVE — AB

## 2022-12-27 SURGERY — EXCISION MASS
Anesthesia: General | Laterality: Left

## 2022-12-27 MED ORDER — ONDANSETRON HCL 4 MG/2ML IJ SOLN
INTRAMUSCULAR | Status: DC | PRN
  Administered 2022-12-27: 4 mg via INTRAVENOUS

## 2022-12-27 MED ORDER — 0.9 % SODIUM CHLORIDE (POUR BTL) OPTIME
TOPICAL | Status: DC | PRN
  Administered 2022-12-27: 1000 mL

## 2022-12-27 MED ORDER — SODIUM CHLORIDE 0.9% FLUSH: 3.0000 mL | INTRAVENOUS | Status: DC | PRN

## 2022-12-27 MED ORDER — GABAPENTIN 300 MG PO CAPS
300.0000 mg | ORAL_CAPSULE | Freq: Three times a day (TID) | ORAL | Status: DC
  Administered 2022-12-27 – 2022-12-28 (×2): 300 mg via ORAL
  Filled 2022-12-27 (×2): qty 1

## 2022-12-27 MED ORDER — HYDROMORPHONE HCL 1 MG/ML IJ SOLN: 1.0000 mg | INTRAMUSCULAR | Status: DC | PRN

## 2022-12-27 MED ORDER — CHLORHEXIDINE GLUCONATE 0.12 % MT SOLN: 15.0000 mL | Freq: Once | OROMUCOSAL | Status: AC

## 2022-12-27 MED ORDER — CHLORHEXIDINE GLUCONATE 0.12 % MT SOLN
OROMUCOSAL | Status: AC
  Administered 2022-12-27: 15 mL via OROMUCOSAL
  Filled 2022-12-27: qty 15

## 2022-12-27 MED ORDER — LISINOPRIL 10 MG PO TABS
10.0000 mg | ORAL_TABLET | Freq: Every morning | ORAL | Status: DC
  Administered 2022-12-28: 10 mg via ORAL
  Filled 2022-12-27: qty 1

## 2022-12-27 MED ORDER — OXYCODONE HCL 5 MG PO TABS: 5.0000 mg | ORAL_TABLET | ORAL | Status: DC | PRN

## 2022-12-27 MED ORDER — DEXAMETHASONE SODIUM PHOSPHATE 10 MG/ML IJ SOLN
INTRAMUSCULAR | Status: AC
  Filled 2022-12-27: qty 1

## 2022-12-27 MED ORDER — PROPOFOL 10 MG/ML IV BOLUS
INTRAVENOUS | Status: AC
  Filled 2022-12-27: qty 20

## 2022-12-27 MED ORDER — HYDROMORPHONE HCL 1 MG/ML IJ SOLN
INTRAMUSCULAR | Status: DC | PRN
  Administered 2022-12-27: .5 mg via INTRAVENOUS

## 2022-12-27 MED ORDER — ACETAMINOPHEN 500 MG PO TABS
ORAL_TABLET | ORAL | Status: AC
  Administered 2022-12-27: 1000 mg via ORAL
  Filled 2022-12-27: qty 2

## 2022-12-27 MED ORDER — SENNOSIDES-DOCUSATE SODIUM 8.6-50 MG PO TABS
1.0000 | ORAL_TABLET | Freq: Two times a day (BID) | ORAL | Status: DC
  Administered 2022-12-27 – 2022-12-28 (×2): 1 via ORAL
  Filled 2022-12-27 (×2): qty 1

## 2022-12-27 MED ORDER — PROPOFOL 10 MG/ML IV BOLUS
INTRAVENOUS | Status: DC | PRN
  Administered 2022-12-27 (×2): 150 mg via INTRAVENOUS

## 2022-12-27 MED ORDER — LIDOCAINE 2% (20 MG/ML) 5 ML SYRINGE
INTRAMUSCULAR | Status: DC | PRN
  Administered 2022-12-27: 60 mg via INTRAVENOUS

## 2022-12-27 MED ORDER — ACETAMINOPHEN 325 MG PO TABS
650.0000 mg | ORAL_TABLET | ORAL | Status: DC | PRN
  Administered 2022-12-27 – 2022-12-28 (×3): 650 mg via ORAL
  Filled 2022-12-27 (×3): qty 2

## 2022-12-27 MED ORDER — ACETAMINOPHEN 650 MG RE SUPP: 650.0000 mg | RECTAL | Status: DC | PRN

## 2022-12-27 MED ORDER — CEFAZOLIN SODIUM-DEXTROSE 2-4 GM/100ML-% IV SOLN
2.0000 g | INTRAVENOUS | Status: AC
  Administered 2022-12-27: 2 g via INTRAVENOUS

## 2022-12-27 MED ORDER — PHENOL 1.4 % MT LIQD: 1.0000 | OROMUCOSAL | Status: DC | PRN

## 2022-12-27 MED ORDER — METHOCARBAMOL 500 MG PO TABS
500.0000 mg | ORAL_TABLET | Freq: Four times a day (QID) | ORAL | Status: DC | PRN
  Administered 2022-12-27 – 2022-12-28 (×2): 500 mg via ORAL
  Filled 2022-12-27 (×2): qty 1

## 2022-12-27 MED ORDER — POLYVINYL ALCOHOL 1.4 % OP SOLN: 1.0000 [drp] | Freq: Three times a day (TID) | OPHTHALMIC | Status: DC | PRN

## 2022-12-27 MED ORDER — OXYCODONE HCL 5 MG PO TABS
10.0000 mg | ORAL_TABLET | ORAL | Status: DC | PRN
  Administered 2022-12-27 – 2022-12-28 (×4): 10 mg via ORAL
  Filled 2022-12-27 (×4): qty 2

## 2022-12-27 MED ORDER — MIDAZOLAM HCL 2 MG/2ML IJ SOLN
INTRAMUSCULAR | Status: AC
  Filled 2022-12-27: qty 2

## 2022-12-27 MED ORDER — ACETAMINOPHEN 500 MG PO TABS: 1000.0000 mg | ORAL_TABLET | Freq: Once | ORAL | Status: AC

## 2022-12-27 MED ORDER — CEFAZOLIN SODIUM-DEXTROSE 2-4 GM/100ML-% IV SOLN
INTRAVENOUS | Status: AC
  Filled 2022-12-27: qty 100

## 2022-12-27 MED ORDER — THROMBIN 20000 UNITS EX SOLR
CUTANEOUS | Status: AC
  Filled 2022-12-27: qty 20000

## 2022-12-27 MED ORDER — PHENYLEPHRINE 80 MCG/ML (10ML) SYRINGE FOR IV PUSH (FOR BLOOD PRESSURE SUPPORT)
PREFILLED_SYRINGE | INTRAVENOUS | Status: AC
  Filled 2022-12-27: qty 10

## 2022-12-27 MED ORDER — FENTANYL CITRATE (PF) 100 MCG/2ML IJ SOLN
INTRAMUSCULAR | Status: AC
  Filled 2022-12-27: qty 2

## 2022-12-27 MED ORDER — METHOCARBAMOL 1000 MG/10ML IJ SOLN: 500.0000 mg | Freq: Four times a day (QID) | INTRAVENOUS | Status: DC | PRN

## 2022-12-27 MED ORDER — ROCURONIUM BROMIDE 10 MG/ML (PF) SYRINGE
PREFILLED_SYRINGE | INTRAVENOUS | Status: DC | PRN
  Administered 2022-12-27: 20 mg via INTRAVENOUS
  Administered 2022-12-27: 30 mg via INTRAVENOUS
  Administered 2022-12-27: 50 mg via INTRAVENOUS

## 2022-12-27 MED ORDER — LIDOCAINE 2% (20 MG/ML) 5 ML SYRINGE
INTRAMUSCULAR | Status: AC
  Filled 2022-12-27: qty 5

## 2022-12-27 MED ORDER — TRANEXAMIC ACID 1000 MG/10ML IV SOLN
INTRAVENOUS | Status: DC | PRN
  Administered 2022-12-27: 1000 mg via INTRAVENOUS

## 2022-12-27 MED ORDER — DEXAMETHASONE SODIUM PHOSPHATE 10 MG/ML IJ SOLN
INTRAMUSCULAR | Status: DC | PRN
  Administered 2022-12-27: 10 mg via INTRAVENOUS

## 2022-12-27 MED ORDER — OXYCODONE-ACETAMINOPHEN 10-325 MG PO TABS: 1.0000 | ORAL_TABLET | Freq: Four times a day (QID) | ORAL | 0 refills | Status: AC | PRN

## 2022-12-27 MED ORDER — ORAL CARE MOUTH RINSE: 15.0000 mL | Freq: Once | OROMUCOSAL | Status: AC

## 2022-12-27 MED ORDER — LACTATED RINGERS IV SOLN: INTRAVENOUS | Status: DC

## 2022-12-27 MED ORDER — ONDANSETRON HCL 4 MG/2ML IJ SOLN
INTRAMUSCULAR | Status: AC
  Filled 2022-12-27: qty 2

## 2022-12-27 MED ORDER — THROMBIN 20000 UNITS EX SOLR
CUTANEOUS | Status: DC | PRN
  Administered 2022-12-27: 20000 [IU] via TOPICAL

## 2022-12-27 MED ORDER — SURGIFLO WITH THROMBIN (HEMOSTATIC MATRIX KIT) OPTIME
TOPICAL | Status: DC | PRN
  Administered 2022-12-27 (×2): 1 via TOPICAL

## 2022-12-27 MED ORDER — FENTANYL CITRATE (PF) 250 MCG/5ML IJ SOLN
INTRAMUSCULAR | Status: AC
  Filled 2022-12-27: qty 5

## 2022-12-27 MED ORDER — ONDANSETRON HCL 4 MG PO TABS: 4.0000 mg | ORAL_TABLET | Freq: Three times a day (TID) | ORAL | 0 refills | Status: AC | PRN

## 2022-12-27 MED ORDER — PHENYLEPHRINE 80 MCG/ML (10ML) SYRINGE FOR IV PUSH (FOR BLOOD PRESSURE SUPPORT)
PREFILLED_SYRINGE | INTRAVENOUS | Status: DC | PRN
  Administered 2022-12-27 (×4): 80 ug via INTRAVENOUS

## 2022-12-27 MED ORDER — EPHEDRINE SULFATE-NACL 50-0.9 MG/10ML-% IV SOSY
PREFILLED_SYRINGE | INTRAVENOUS | Status: DC | PRN
  Administered 2022-12-27: 10 mg via INTRAVENOUS

## 2022-12-27 MED ORDER — POLYETHYLENE GLYCOL 3350 17 G PO PACK: 17.0000 g | PACK | Freq: Every day | ORAL | Status: DC | PRN

## 2022-12-27 MED ORDER — SODIUM CHLORIDE 0.9 % IV SOLN: 250.0000 mL | INTRAVENOUS | Status: DC

## 2022-12-27 MED ORDER — SUGAMMADEX SODIUM 200 MG/2ML IV SOLN
INTRAVENOUS | Status: DC | PRN
  Administered 2022-12-27: 300 mg via INTRAVENOUS

## 2022-12-27 MED ORDER — BUPIVACAINE-EPINEPHRINE (PF) 0.25% -1:200000 IJ SOLN
INTRAMUSCULAR | Status: AC
  Filled 2022-12-27: qty 30

## 2022-12-27 MED ORDER — BUPIVACAINE-EPINEPHRINE 0.25% -1:200000 IJ SOLN
INTRAMUSCULAR | Status: DC | PRN
  Administered 2022-12-27: 10 mL

## 2022-12-27 MED ORDER — EPHEDRINE 5 MG/ML INJ
INTRAVENOUS | Status: AC
  Filled 2022-12-27: qty 5

## 2022-12-27 MED ORDER — SODIUM CHLORIDE 0.9% FLUSH
3.0000 mL | Freq: Two times a day (BID) | INTRAVENOUS | Status: DC
  Administered 2022-12-27: 3 mL via INTRAVENOUS

## 2022-12-27 MED ORDER — CEFAZOLIN SODIUM-DEXTROSE 1-4 GM/50ML-% IV SOLN
1.0000 g | Freq: Three times a day (TID) | INTRAVENOUS | Status: AC
  Administered 2022-12-27 – 2022-12-28 (×2): 1 g via INTRAVENOUS
  Filled 2022-12-27 (×2): qty 50

## 2022-12-27 MED ORDER — HYDROMORPHONE HCL 1 MG/ML IJ SOLN
INTRAMUSCULAR | Status: AC
  Filled 2022-12-27: qty 0.5

## 2022-12-27 MED ORDER — ONDANSETRON HCL 4 MG/2ML IJ SOLN: 4.0000 mg | Freq: Four times a day (QID) | INTRAMUSCULAR | Status: DC | PRN

## 2022-12-27 MED ORDER — HEMOSTATIC AGENTS (NO CHARGE) OPTIME
TOPICAL | Status: DC | PRN
  Administered 2022-12-27: 1 via TOPICAL

## 2022-12-27 MED ORDER — MIDAZOLAM HCL 2 MG/2ML IJ SOLN
INTRAMUSCULAR | Status: DC | PRN
  Administered 2022-12-27: 2 mg via INTRAVENOUS

## 2022-12-27 MED ORDER — METHOCARBAMOL 500 MG PO TABS: 500.0000 mg | ORAL_TABLET | Freq: Three times a day (TID) | ORAL | 0 refills | Status: AC | PRN

## 2022-12-27 MED ORDER — MENTHOL 3 MG MT LOZG: 1.0000 | LOZENGE | OROMUCOSAL | Status: DC | PRN

## 2022-12-27 MED ORDER — ONDANSETRON HCL 4 MG PO TABS: 4.0000 mg | ORAL_TABLET | Freq: Four times a day (QID) | ORAL | Status: DC | PRN

## 2022-12-27 MED ORDER — MAGNESIUM CITRATE PO SOLN: 1.0000 | Freq: Once | ORAL | Status: DC | PRN

## 2022-12-27 MED ORDER — ROCURONIUM BROMIDE 10 MG/ML (PF) SYRINGE
PREFILLED_SYRINGE | INTRAVENOUS | Status: AC
  Filled 2022-12-27: qty 10

## 2022-12-27 MED ORDER — FENTANYL CITRATE (PF) 100 MCG/2ML IJ SOLN
25.0000 ug | INTRAMUSCULAR | Status: DC | PRN
  Administered 2022-12-27: 25 ug via INTRAVENOUS

## 2022-12-27 MED ORDER — FENTANYL CITRATE (PF) 250 MCG/5ML IJ SOLN
INTRAMUSCULAR | Status: DC | PRN
  Administered 2022-12-27: 100 ug via INTRAVENOUS
  Administered 2022-12-27: 50 ug via INTRAVENOUS
  Administered 2022-12-27: 100 ug via INTRAVENOUS

## 2022-12-27 MED ORDER — PHENYLEPHRINE HCL-NACL 20-0.9 MG/250ML-% IV SOLN
INTRAVENOUS | Status: DC | PRN
  Administered 2022-12-27: 30 ug/min via INTRAVENOUS

## 2022-12-27 MED ORDER — TRANEXAMIC ACID-NACL 1000-0.7 MG/100ML-% IV SOLN
INTRAVENOUS | Status: AC
  Filled 2022-12-27: qty 100

## 2022-12-27 SURGICAL SUPPLY — 66 items
AGENT HMST KT MTR STRL THRMB (HEMOSTASIS) ×2
BAG COUNTER SPONGE SURGICOUNT (BAG) ×1 IMPLANT
BAG SPNG CNTER NS LX DISP (BAG) ×1
BLADE CLIPPER SURG (BLADE) IMPLANT
BNDG GAUZE DERMACEA FLUFF 4 (GAUZE/BANDAGES/DRESSINGS) ×1 IMPLANT
BNDG GZE DERMACEA 4 6PLY (GAUZE/BANDAGES/DRESSINGS) ×1
CANISTER SUCT 3000ML PPV (MISCELLANEOUS) ×1 IMPLANT
CLSR STERI-STRIP ANTIMIC 1/2X4 (GAUZE/BANDAGES/DRESSINGS) IMPLANT
CORD BIPOLAR FORCEPS 12FT (ELECTRODE) ×1 IMPLANT
COVER SURGICAL LIGHT HANDLE (MISCELLANEOUS) ×1 IMPLANT
DRAIN CHANNEL 15F RND FF W/TCR (WOUND CARE) IMPLANT
DRAIN PENROSE 12X.25 LTX STRL (MISCELLANEOUS) IMPLANT
DRAPE POUCH INSTRU U-SHP 10X18 (DRAPES) ×1 IMPLANT
DRAPE SURG 17X23 STRL (DRAPES) ×1 IMPLANT
DRAPE U-SHAPE 47X51 STRL (DRAPES) ×1 IMPLANT
DRSG OPSITE POSTOP 3X4 (GAUZE/BANDAGES/DRESSINGS) ×1 IMPLANT
DRSG OPSITE POSTOP 4X6 (GAUZE/BANDAGES/DRESSINGS) IMPLANT
DRSG OPSITE POSTOP 4X8 (GAUZE/BANDAGES/DRESSINGS) IMPLANT
DRSG TELFA 3X8 NADH STRL (GAUZE/BANDAGES/DRESSINGS) IMPLANT
DURAPREP 26ML APPLICATOR (WOUND CARE) ×1 IMPLANT
ELECT BLADE 4.0 EZ CLEAN MEGAD (MISCELLANEOUS)
ELECT CAUTERY BLADE 6.4 (BLADE) ×1 IMPLANT
ELECT PENCIL ROCKER SW 15FT (MISCELLANEOUS) ×1 IMPLANT
ELECT REM PT RETURN 9FT ADLT (ELECTROSURGICAL) ×1
ELECTRODE BLDE 4.0 EZ CLN MEGD (MISCELLANEOUS) IMPLANT
ELECTRODE REM PT RTRN 9FT ADLT (ELECTROSURGICAL) ×1 IMPLANT
EVACUATOR SILICONE 100CC (DRAIN) IMPLANT
GAUZE SPONGE 4X4 12PLY STRL (GAUZE/BANDAGES/DRESSINGS) IMPLANT
GLOVE BIO SURGEON STRL SZ 6.5 (GLOVE) ×1 IMPLANT
GLOVE BIOGEL PI IND STRL 6.5 (GLOVE) ×1 IMPLANT
GLOVE BIOGEL PI IND STRL 8.5 (GLOVE) ×1 IMPLANT
GLOVE SS BIOGEL STRL SZ 8.5 (GLOVE) ×1 IMPLANT
GOWN STRL REUS W/ TWL LRG LVL3 (GOWN DISPOSABLE) ×1 IMPLANT
GOWN STRL REUS W/TWL 2XL LVL3 (GOWN DISPOSABLE) ×1 IMPLANT
GOWN STRL REUS W/TWL LRG LVL3 (GOWN DISPOSABLE) ×1
HEMOSTAT ARISTA ABSORB 3G PWDR (HEMOSTASIS) IMPLANT
KIT BASIN OR (CUSTOM PROCEDURE TRAY) ×1 IMPLANT
KIT TURNOVER KIT B (KITS) ×1 IMPLANT
NDL 22X1.5 STRL (OR ONLY) (MISCELLANEOUS) ×1 IMPLANT
NDL SPNL 18GX3.5 QUINCKE PK (NEEDLE) ×2 IMPLANT
NEEDLE 22X1.5 STRL (OR ONLY) (MISCELLANEOUS) ×1 IMPLANT
NEEDLE SPNL 18GX3.5 QUINCKE PK (NEEDLE) ×2 IMPLANT
NS IRRIG 1000ML POUR BTL (IV SOLUTION) ×1 IMPLANT
PACK LAMINECTOMY ORTHO (CUSTOM PROCEDURE TRAY) ×1 IMPLANT
PACK UNIVERSAL I (CUSTOM PROCEDURE TRAY) ×1 IMPLANT
PAD ARMBOARD 7.5X6 YLW CONV (MISCELLANEOUS) ×2 IMPLANT
PATTIES SURGICAL .5 X.5 (GAUZE/BANDAGES/DRESSINGS) IMPLANT
PATTIES SURGICAL .5 X1 (DISPOSABLE) ×1 IMPLANT
SPONGE SURGIFOAM ABS GEL SZ50 (HEMOSTASIS) ×1 IMPLANT
STAPLER VISISTAT 35W (STAPLE) IMPLANT
STRIP CLOSURE SKIN 1/2X4 (GAUZE/BANDAGES/DRESSINGS) IMPLANT
SURGIFLO W/THROMBIN 8M KIT (HEMOSTASIS) IMPLANT
SUT BONE WAX W31G (SUTURE) ×1 IMPLANT
SUT MNCRL AB 3-0 PS2 27 (SUTURE) ×1 IMPLANT
SUT MNCRL+ AB 3-0 CT1 36 (SUTURE) IMPLANT
SUT SILK 2 0 SH (SUTURE) IMPLANT
SUT STRATAFIX PDS 30 CT-1 (SUTURE) IMPLANT
SUT VIC AB 1 CT1 18XCR BRD 8 (SUTURE) ×1 IMPLANT
SUT VIC AB 1 CT1 8-18 (SUTURE) ×1
SUT VIC AB 2-0 CT1 18 (SUTURE) ×1 IMPLANT
SYR BULB IRRIG 60ML STRL (SYRINGE) ×1 IMPLANT
SYR CONTROL 10ML LL (SYRINGE) ×1 IMPLANT
TOWEL GREEN STERILE (TOWEL DISPOSABLE) ×1 IMPLANT
TOWEL GREEN STERILE FF (TOWEL DISPOSABLE) ×1 IMPLANT
WATER STERILE IRR 1000ML POUR (IV SOLUTION) ×1 IMPLANT
YANKAUER SUCT BULB TIP NO VENT (SUCTIONS) ×1 IMPLANT

## 2022-12-27 NOTE — H&P (Signed)
History: Kevin Stafford is a very pleasant active 76 year old gentleman with ongoing progressive back buttock and neuropathic left leg pain. While the patient does have a history of prostate cancer status post chemotherapy and radiation 15 years ago there is no signs of metastatic disease on his most recent MRI. 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 Patient's clinical exam is consistent with motor and sensory deficits in the L5 dermatome.  As a result we will mpve fprward with a decompression and removal of the cyst.  Past Medical History:  Diagnosis Date   Arthritis    Bladder retention of urine    foley since  09/27/11   History of kidney stones    History of urostomy    Hypertension    Nocturia    Osteomyelitis of pelvis (HCC)    Prostate cancer (HCC) S/P RADIOACTIVE SEED IMPLANTS 2008 AND CRYOABLATION OF PROSTATE [id number]   Recurrent UTI    Toxicity, radiation    "radiation in 2008-too much, bladder shrunk and prostate exploded"2012 s/s started so went to [facility]    No Known Allergies  No current facility-administered medications on file prior to encounter.   Current Outpatient Medications on File Prior to Encounter  Medication Sig Dispense Refill   atorvastatin (LIPITOR) 10 MG tablet Take 10 mg by mouth daily.     lisinopril (PRINIVIL,ZESTRIL) 10 MG tablet Take 10 mg by mouth every morning.     polyvinyl alcohol (LIQUIFILM TEARS) 1.4 % ophthalmic solution Place 1 drop into both eyes 3 (three) times daily as needed for dry eyes.     Leuprolide Acetate (LUPRON DEPOT IM) Inject 1 Dose into the muscle every 6 (six) months.     methocarbamol (ROBAXIN) 500 MG tablet Take 1 tablet (500 mg total) by mouth every 6 (six) hours as needed for muscle spasms. (Patient not taking: Reported on 12/23/2022) 80 tablet 0   oxyCODONE (OXY IR/ROXICODONE) 5 MG immediate release tablet Take 1-3 tablets (5-15 mg total) by mouth every 3 (three) hours as needed for moderate pain,  severe pain or breakthrough pain. (Patient not taking: Reported on 12/23/2022) 90 tablet 0   rivaroxaban (XARELTO) 10 MG TABS tablet Take 1 tablet (10 mg total) by mouth daily with breakfast. Take Xarelto for two and a half more weeks, then discontinue Xarelto. Once the patient has completed the blood thinner regimen, then take a Baby 81 mg Aspirin daily for three more weeks. (Patient not taking: Reported on 12/23/2022) 19 tablet 0   Sipuleucel-T (PROVENGE IV) Inject into the vein.     traMADol (ULTRAM) 50 MG tablet Take 1-2 tablets (50-100 mg total) by mouth every 6 (six) hours as needed (mild pain). (Patient not taking: Reported on 12/23/2022) 60 tablet 1    Physical Exam: Clinical exam: Kevin Stafford is a pleasant individual, who appears younger than their stated age.  He is alert and orientated 3.  No shortness of breath, chest pain.  Abdomen is soft and non-tender, negative loss of bowel and bladder control, no rebound tenderness.  Negative: skin lesions abrasions contusions  Peripheral pulses: 2+ dorsalis pedis/posterior tibialis pulses bilaterally. LE compartments are: Soft and nontender.  Gait pattern: Normal  Assistive devices: None  Neuro: 5/5 motor strength in the right lower extremity. 4/5 left EHL/tibialis anterior strength. 5/5 motor strength in remainder of the left lower extremity. Symmetrical 1+ deep tendon reflexes. Occasional dysesthesias into the left lower extremity. Positive left radicular leg pain with straight leg maneuvering. Negative  Babinski test, no clonus  Musculoskeletal: Moderate low back pain in the midline radiating into the paraspinal region primarily on the left side.  Imaging: X-rays of the lumbar spine demonstrate some mild scoliosis and multilevel lumbar degenerative disc disease. There is SI joint degeneration bilaterally with loss of normal joint space. No fracture is seen.  Lumbar MRI: completed on 11/12/2022 was reviewed with the patient. It was completed  at [facility]; I have independently reviewed the images as well as the radiology report. Multilevel degenerative disc disease with loss of normal disc height L2-3, moderate loss of disc height L3-4. Neuroforamen are patent and there is no significant stenosis L2-4. Large 16 mm synovial cyst at L4-5 projecting medially from the left facet joint. This produces severe central canal stenosis with displacement of the left L5 nerve root. Mild degenerative changes but no mass effect at L5-S1. No fracture or abnormal marrow signal change.  A/P:Summary: Kevin Stafford is a very pleasant active 77 year old gentleman with ongoing progressive back buttock and neuropathic left leg pain. While the patient does have a history of prostate cancer status post chemotherapy and radiation 15 years ago there is no signs of metastatic disease on his most recent MRI. 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 Patient's clinical exam is consistent with motor and sensory deficits in the L5 dermatome.  Patient recently had a injection/aspiration attempt which was unsuccessful. His quality of life remains poor. Patient is now going on 5 months of symptoms that have gotten progressively worse. At this point I am he is expressed a desire to move forward with surgery which I think is the appropriate next step. The goal of surgery is to remove the cyst and alleviate the neural compression. Secondarily, I hope is that the motor deficits improved.  We have gone over the surgical procedure in great detail including the risks, benefits, and alternatives to surgery.  Risks and benefits of lumbar decompression/discectomy: Infection, bleeding, death, stroke, paralysis, ongoing or worse pain, need for additional surgery, leak of spinal fluid, adjacent segment degeneration requiring additional surgery, post-operative hematoma formation that can result in neurological compromise and the need for urgent/emergent re-operation. Loss in  bowel and bladder control. Injury to major vessels that could result in the need for urgent abdominal surgery to stop bleeding. Risk of deep venous thrombosis (DVT) and the need for additional treatment. Recurrent disc herniation resulting in the need for revision surgery, which could include fusion surgery (utilizing instrumentation such as pedicle screws and intervertebral cages).

## 2022-12-27 NOTE — Discharge Instructions (Addendum)
Laminectomy, Care After This sheet gives you information about how to care for yourself after your procedure. Your health care provider may also give you more specific instructions. If you have problems or questions, contact your health care provider. What can I expect after the procedure? After the procedure, it is common to have: Some pain around your incision area. Muscle tightening (spasms) across the back.   Follow these instructions at home: Incision care Follow instructions from your health care provider about how to take care of your incision area. Make sure you: Wash your hands with soap and water before and after you apply medicine to the area or change your bandage (dressing). If soap and water are not available, use hand sanitizer. Change your dressing as told by your health care provider. Leave stitches (sutures), skin glue, or adhesive strips in place. These skin closures may need to stay in place for 2 weeks or longer. If adhesive strip edges start to loosen and curl up, you may trim the loose edges. Do not remove adhesive strips completely unless your health care provider tells you to do that.  Check your incision area every day for signs of infection. Check for: More redness, swelling, or pain. More fluid or blood. Warmth. Pus or a bad smell. Medicines Take over-the-counter and prescription medicines only as told by your health care provider. If you were prescribed an antibiotic medicine, use it as told by your health care provider. Do not stop using the antibiotic even if you start to feel better. If needed, call office in 3 days to request refill of pain medications. Bathing Do not take baths, swim, or use a hot tub for 6 weeks, or until your incision has healed completely. If your health care provider approves, you may take showers after your dressing has been removed. Ok to shower in 5 days Activity Return to your normal activities as told by your health care  provider. Ask your health care provider what activities are safe for you. Avoid bending or twisting at your waist. Always bend at your knees. Do not sit for more than 20-30 minutes at a time. Lie down or walk between periods of sitting. Do not lift anything that is heavier than 10 lb (4.5 kg) or the limit that your health care provider tells you, until he or she says that it is safe. Do not drive for 2 weeks after your procedure or for as long as your health care provider tells you.  Do not drive or use heavy machinery while taking prescription pain medicine. General instructions To prevent or treat constipation while you are taking prescription pain medicine, your health care provider may recommend that you: Drink enough fluid to keep your urine clear or pale yellow. Take over-the-counter or prescription medicines. Eat foods that are high in fiber, such as fresh fruits and vegetables, whole grains, and beans. Limit foods that are high in fat and processed sugars, such as fried and sweet foods. Do breathing exercises as told. Keep all follow-up visits as told by your health care provider. This is important. Contact a health care provider if: You have more redness, swelling, or pain around your incision area. Your incision feels warm to the touch. You are not able to return to activities or do exercises as told by your health care provider. Get help right away if: You have: More fluid or blood coming from your incision area. Pus or a bad smell coming from your incision area. Chills or a fever.   Episodes of dizziness or fainting while standing. You develop a rash. You develop shortness of breath or you have difficulty breathing. You cannot control when you urinate or have a bowel movement. You become weak. You are not able to use your legs. Summary After the procedure, it is common to have some pain around your incision area. You may also have muscle tightening (spasms) across the  back. Follow instructions from your health care provider about how to care for your incision. Do not lift anything that is heavier than 10 lb (4.5 kg) or the limit that your health care provider tells you, until he or she says that it is safe. Contact your health care provider if you have more redness, swelling, or pain around your incision area or if your incision feels warm to the touch. These can be signs of infection. This information is not intended to replace advice given to you by your health care provider. Make sure you discuss any questions you have with your health care provider. Refer to this sheet in the next few weeks. These instructions provide you with information about caring for yourself after your procedure. Your health care provider may also give you more specific instructions. Your treatment has been planned according to current medical practices, but problems sometimes occur. Call your health care provider if you have any problems or questions after your procedure. What can I expect after the procedure? It is common to have pain for the first few days after the procedure. Some people continue to have mild pain even after making a full recovery. Follow these instructions at home: Medicine Take medicines only as directed by your health care provider. Avoid taking over-the-counter pain medicines unless your health care provider tells you otherwise. These medicines interfere with the development and growth of new bone cells. If you were prescribed a narcotic pain medicine, take it exactly as told by your health care provider. Do not drink alcohol while on the medicine. Do not drive while on the medicine. Injury care Care for your back brace as told by your health care provider. If directed, apply ice to the injured area: Put ice in a plastic bag. Place a towel between your skin and the bag. Leave the ice on for 20 minutes, 2-3 times a day. Activity Perform physical therapy  exercises as told by your health care provider. Exercise regularly. Start by taking short walks. Slowly increase your activity level over time. Gentle exercise helps to ease pain. Sit, stand, walk, turn in bed, and reposition yourself as told by your health care provider. This will help to keep your spine in proper alignment. Avoid bending and twisting your body. Avoid doing strenuous household chores, such as vacuuming. Do not lift anything that is heavier than 10 lb (4.5 kg). Other Instructions Keep all follow-up visits as directed by your health care provider. This is important. Do not use any tobacco products, including cigarettes, chewing tobacco, or electronic cigarettes. If you need help quitting, ask your health care provider. Nicotine affects the way bones heal. Contact a health care provider if: Your pain gets worse. You have a fever. You have redness, swelling, or pain at the site of your incision. You have fluid, blood, or pus coming from your incision. You have numbness, tingling, or weakness in any part of your body. Get help right away if: Your incision feels swollen and tender, and the surrounding area looks like a lump. The lump may be red or bluish in color. You   cannot move any part of your body (paralysis). You cannot control your bladder or bowels.  RESTART XARELTO ON 12/29/22 (EVENING)

## 2022-12-27 NOTE — Op Note (Signed)
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  L4 and L5 foraminotomies  SURGEON:  Venita Lick, MD  PHYSICIAN ASSISTANT: None  ANESTHESIA:   General  EBL: 75 ml   Complications: None  BRIEF HISTORY: [name] is a 77 y.o. male who presented to my complaints of severe back buttock and radicular left leg pain.  Imaging demonstrated a very large synovial cyst causing significant central and lateral recess stenosis at L4-5.  As result of the failure to improve with conservative measures we elected to move forward with surgery.  All appropriate risks, benefits, and alternatives to surgery were discussed with the patient and consent was obtained.  PROCEDURE DETAILS: Patient was brought into the operating room and was properly positioned on the operating room table.  After induction with general anesthesia the patient was endotracheally intubated.  A timeout was taken to confirm all important data: including patient, procedure, and the level. Teds, SCD's were applied.   Patient was turned prone onto the Wilson frame and all bony prominences and his nephrectomy back padded and the back was prepped and draped in a standard fashion.  2 needles were placed into the lumbar spine and an x-ray was taken to localize our skin incision.  I marked out my skin incision and infiltrated with quarter percent Marcaine with epinephrine.  Midline incision was made and sharp dissection was carried out down to the deep fascia.  I incised the deep fascia and stripped the paraspinal muscles to expose the L4 and L5 spinous process and the L4-5 facet complex.  Self-retaining retractor  was placed and a x-ray was taken with a marker to confirm I was at the appropriate level.  At this point the inferior third of the spinous process of L4 was resected and a lamina spreader was placed.  I then performed a generous laminotomy on the left side of L4.  I resected a significant amount of lamina given the size and cranial migration of the cyst.  I then resected the medial third of the L4 facet.  I then went centrally and began dissecting through the central raphae.  There was significant displacement by the cyst consistent with what was seen on the MRI.  I continued to remove the ligamentum flavum to expose the underlying cyst.  Ultimately I was able to create a plane between the thecal sac and the cyst itself.  I dissected with the Penfield 4 and Kerrison rongeur to resect the cyst.  The cyst wall was violated and a copious amount of thick proteinaceous material consistent with a synovial cyst was expressed.  I collected this for evaluation by the pathologist.  I also remove the bulk of the wall.  The cyst had actually migrated superiorly and I continued to dissect superiorly.  At this point with the majority of the cyst removed I was able to see the thecal sac reexpanded into the space to was occupied by the cyst.  I then continued onto the right side removing the ligamentum flavum and ensuring I had adequate space for the thecal sac.  I could freely mobilized the thecal sac on the right-hand side and on the left.  I could see down to  the L5 nerve root and I this nerve root to the L5 pedicle.  I then resected the medial portion of the superior L5 facet in order to adequately decompress the lateral recess.  There was some cyst wall that had remained very adherent to the thecal sac.  Despite attempts at gentle mobilization I elected not to continue for fear of a traumatic durotomy.  The entire area was adequately decompressed.  I was able to take a second x-ray with instruments showing the decompression  spanning from the inferior aspect of the L4 pedicle to the inferior aspect of the L5 pedicle.  This spanned the area of concern based on the preoperative MRI.  I was able to perform a foraminotomy of L5 and L4 with Kerrison rongeurs to further decompress the L4 and L5 nerve roots.  The lateral recess itself on the left side was also adequately decompressed.  The thecal sac was now freely mobile and I could reach over to the contralateral side and confirm that it was adequately decompressed.  At this point time stasis was obtained using proper electrocautery as well as Floseal and TXA.  Bleeding bone edges were sealed with bone wax.  I did place a drain out of a stab incision.  After final irrigation I removed the retractor and closed in a layered fashion with a #1 strata fix barbed suture followed by interrupted 2-0 Vicryl sutures and finally a 3-0 Monocryl for the skin.  Steri-Strips and a dry dressing were applied.  The drain was sutured in with a 2-0 silk and a dry dressing was applied.  The patient was ultimately extubated and transferred the PACU without incident.  The end of the case all needle sponge counts were correct.  There were no adverse intraoperative events.  Venita Lick, MD 12/27/2022 4:52 PM

## 2022-12-27 NOTE — Anesthesia Preprocedure Evaluation (Addendum)
Anesthesia Evaluation  Patient identified by MRN, date of birth, ID band Patient awake    Reviewed: Allergy & Precautions, NPO status , Patient's Chart, lab work & pertinent test results  Airway Mallampati: II  TM Distance: >3 FB Neck ROM: Full    Dental no notable dental hx. (+) Teeth Intact, Dental Advisory Given   Pulmonary neg pulmonary ROS   Pulmonary exam normal breath sounds clear to auscultation       Cardiovascular hypertension, Pt. on medications Normal cardiovascular exam Rhythm:Regular Rate:Normal     Neuro/Psych negative neurological ROS  negative psych ROS   GI/Hepatic negative GI ROS, Neg liver ROS,,,  Endo/Other  negative endocrine ROS    Renal/GU negative Renal ROS   H/p prostate CA, urostomy tube negative genitourinary   Musculoskeletal  (+) Arthritis ,    Abdominal   Peds  Hematology negative hematology ROS (+)   Anesthesia Other Findings   Reproductive/Obstetrics                             Anesthesia Physical Anesthesia Plan  ASA: 2  Anesthesia Plan: General   Post-op Pain Management: Tylenol PO (pre-op)*   Induction: Intravenous  PONV Risk Score and Plan: 2 and Dexamethasone, Ondansetron and Treatment may vary due to age or medical condition  Airway Management Planned: Oral ETT  Additional Equipment:   Intra-op Plan:   Post-operative Plan: Extubation in OR  Informed Consent: I have reviewed the patients History and Physical, chart, labs and discussed the procedure including the risks, benefits and alternatives for the proposed anesthesia with the patient or authorized representative who has indicated his/her understanding and acceptance.     Dental advisory given  Plan Discussed with: CRNA  Anesthesia Plan Comments:        Anesthesia Quick Evaluation

## 2022-12-27 NOTE — Transfer of Care (Signed)
Immediate Anesthesia Transfer of Care Note  Patient: Kevin Stafford  Procedure(s) Performed: Cyst excision left LUMBAR FOUR -FIVE (Left)  Patient Location: PACU  Anesthesia Type:General  Level of Consciousness: drowsy and patient cooperative  Airway & Oxygen Therapy: Patient Spontanous Breathing and Patient connected to face mask oxygen  Post-op Assessment: Report given to RN and Post -op Vital signs reviewed and stable  Post vital signs: Reviewed and stable  Last Vitals:  Vitals Value Taken Time  BP 147/88 12/27/22 1701  Temp    Pulse 81 12/27/22 1702  Resp 17 12/27/22 1702  SpO2 95 % 12/27/22 1702  Vitals shown include unvalidated device data.  Last Pain:  Vitals:   12/27/22 1031  TempSrc:   PainSc: 0-No pain         Complications: No notable events documented.

## 2022-12-27 NOTE — Brief Op Note (Signed)
12/27/2022  5:01 PM  PATIENT:  Kevin Stafford  77 y.o. male  PRE-OPERATIVE DIAGNOSIS:  Left L4-5 facet cyst with radiculopathy  POST-OPERATIVE DIAGNOSIS:  Left L4-5 facet cyst with radiculopathy  PROCEDURE:  Procedure(s): Cyst excision left LUMBAR FOUR -FIVE (Left)  SURGEON:  Surgeon(s) and Role:    Venita Lick, MD - Primary  PHYSICIAN ASSISTANT:   ASSISTANTS: none   ANESTHESIA:   general  EBL:  75 mL   BLOOD ADMINISTERED:none  DRAINS:  1 JP drain    LOCAL MEDICATIONS USED:  MARCAINE     SPECIMEN:  Source of Specimen:  L4/5 facet cyst  DISPOSITION OF SPECIMEN:  PATHOLOGY  COUNTS:  YES  TOURNIQUET:  * No tourniquets in log *  DICTATION: .Dragon Dictation  PLAN OF CARE: Admit for overnight observation  PATIENT DISPOSITION:  PACU - hemodynamically stable.

## 2022-12-27 NOTE — OR Nursing (Signed)
Dr. Chales Abrahams called at 1438 and confirmed level Lumbar four - Lumbar five is the correct level that Dr. Shon Baton is working on.

## 2022-12-28 ENCOUNTER — Encounter (HOSPITAL_COMMUNITY): Payer: Self-pay | Admitting: Orthopedic Surgery

## 2022-12-28 DIAGNOSIS — M7138 Other bursal cyst, other site: Secondary | ICD-10-CM | POA: Diagnosis not present

## 2022-12-28 NOTE — Discharge Summary (Signed)
Patient ID: Kevin Stafford MRN: [id number] DOB/AGE: November 15, 1945 77 y.o.  Admit date: 12/27/2022 Discharge date: 12/28/2022  Admission Diagnoses:  Principal Problem:   Synovial cyst of lumbar facet joint   Discharge Diagnoses:  Principal Problem:   Synovial cyst of lumbar facet joint  status post Procedure(s): Cyst excision left LUMBAR FOUR -FIVE  Past Medical History:  Diagnosis Date   Arthritis    Bladder retention of urine    foley since  09/27/11   History of kidney stones    History of urostomy    Hypertension    Nocturia    Osteomyelitis of pelvis (HCC)    Prostate cancer (HCC) S/P RADIOACTIVE SEED IMPLANTS 2008 AND CRYOABLATION OF PROSTATE [id number]   Recurrent UTI    Toxicity, radiation    "radiation in 2008-too much, bladder shrunk and prostate exploded"2012 s/s started so went to [facility]    Surgeries: Procedure(s): Cyst excision left LUMBAR FOUR -FIVE on 12/27/2022   Consultants:   Discharged Condition: Improved  Hospital Course: Kevin Stafford is an 77 y.o. male who was admitted 12/27/2022 for operative treatment of Synovial cyst of lumbar facet joint. Patient failed conservative treatments (please see the history and physical for the specifics) and had severe unremitting pain that affects sleep, daily activities and work/hobbies. After pre-op clearance, the patient was taken to the operating room on 12/27/2022 and underwent  Procedure(s): Cyst excision left LUMBAR FOUR -FIVE.    Patient was given perioperative antibiotics:  Anti-infectives (From admission, onward)    Start     Dose/Rate Route Frequency Ordered Stop   12/27/22 1815  ceFAZolin (ANCEF) IVPB 1 g/50 mL premix        1 g 100 mL/hr over 30 Minutes Intravenous Every 8 hours 12/27/22 1810 12/28/22 0218   12/27/22 1010  ceFAZolin (ANCEF) 2-4 GM/100ML-% IVPB       Note to Pharmacy: Isabel Caprice, Destiny: cabinet override      12/27/22 1010 12/27/22 1359   12/27/22 1006  ceFAZolin (ANCEF) IVPB 2g/100 mL premix         2 g 200 mL/hr over 30 Minutes Intravenous 30 min pre-op 12/27/22 1006 12/27/22 1359        Patient was given sequential compression devices and early ambulation to prevent DVT.   Patient benefited maximally from hospital stay and there were no complications. At the time of discharge, the patient was urinating/moving their bowels without difficulty, tolerating a regular diet, pain is controlled with oral pain medications and they have been cleared by PT/OT.   Recent vital signs: Patient Vitals for the past 24 hrs:  BP Temp Temp src Pulse Resp SpO2 Height Weight  12/28/22 0727 121/73 98 F (36.7 C) Oral 76 16 95 % -- --  12/28/22 0439 108/61 98.3 F (36.8 C) Oral 79 18 93 % -- --  12/27/22 2332 119/66 98.4 F (36.9 C) Oral 89 18 94 % -- --  12/27/22 1933 (!) 143/82 97.7 F (36.5 C) Oral 95 18 96 % -- --  12/27/22 1836 (!) 142/80 98 F (36.7 C) -- 86 18 95 % -- --  12/27/22 1815 139/79 -- -- 84 12 92 % -- --  12/27/22 1800 133/78 97.8 F (36.6 C) -- 81 14 92 % -- --  12/27/22 1745 134/77 -- -- 81 13 92 % -- --  12/27/22 1730 137/77 -- -- 81 18 93 % -- --  12/27/22 1715 139/74 -- -- 80 16 94 % -- --  12/27/22 1700 Marland Kitchen)  147/88 98 F (36.7 C) -- 78 (!) 26 94 % -- --  12/27/22 1013 (!) 161/97 97.9 F (36.6 C) Oral 96 18 95 % 6\' 3"  (1.905 m) 102.1 kg     Recent laboratory studies:  Recent Labs    12/27/22 1008  WBC 5.6  HGB 13.9  HCT 43.3  PLT 211  NA 136  K 4.4  CL 107  CO2 20*  BUN 19  CREATININE 0.81  GLUCOSE 99  CALCIUM 8.5*     Discharge Medications:   Allergies as of 12/28/2022   No Known Allergies      Medication List     STOP taking these medications    LUPRON DEPOT IM   oxyCODONE 5 MG immediate release tablet Commonly known as: Oxy IR/ROXICODONE   PROVENGE IV   traMADol 50 MG tablet Commonly known as: ULTRAM       TAKE these medications    atorvastatin 10 MG tablet Commonly known as: LIPITOR Take 10 mg by mouth daily.   lisinopril  10 MG tablet Commonly known as: ZESTRIL Take 10 mg by mouth every morning.   methocarbamol 500 MG tablet Commonly known as: ROBAXIN Take 1 tablet (500 mg total) by mouth every 8 (eight) hours as needed for up to 5 days for muscle spasms. What changed: when to take this   ondansetron 4 MG tablet Commonly known as: Zofran Take 1 tablet (4 mg total) by mouth every 8 (eight) hours as needed for nausea or vomiting.   oxyCODONE-acetaminophen 10-325 MG tablet Commonly known as: Percocet Take 1 tablet by mouth every 6 (six) hours as needed for up to 5 days for pain.   polyvinyl alcohol 1.4 % ophthalmic solution Commonly known as: LIQUIFILM TEARS Place 1 drop into both eyes 3 (three) times daily as needed for dry eyes.   rivaroxaban 10 MG Tabs tablet Commonly known as: XARELTO Take 1 tablet (10 mg total) by mouth daily with breakfast. Take Xarelto for two and a half more weeks, then discontinue Xarelto. Once the patient has completed the blood thinner regimen, then take a Baby 81 mg Aspirin daily for three more weeks.        Diagnostic Studies: DG Lumbar Spine 1 View  Result Date: 12/27/2022 CLINICAL DATA:  Intraop cyst excision EXAM: LUMBAR SPINE - 1 VIEW COMPARISON:  Same day intraop film acquired at 2:29 p.m. FINDINGS: Single Intraop localization image of the lumbar spine. Localization devices are seen posterior to the L4 and L5 vertebral bodies. No acute osseous abnormality. IMPRESSION: Localization devices are seen posterior to the L4 and L5 vertebral bodies. Electronically Signed   By: Allegra Lai M.D.   On: 12/27/2022 18:00   DG Lumbar Spine 1 View  Result Date: 12/27/2022 CLINICAL DATA:  Surgery. EXAM: LUMBAR SPINE - 1 VIEW intraoperative portable for localization COMPARISON:  X-ray earlier 12/27/2022 FINDINGS: Single lateral portable view obtained of the lumbar spine demonstrates surgical instruments along the posterior elements superiorly overlapping the facet joint region  at L4-5. Imaging was obtained to aid in treatment. As requested findings were called directly to the operating room at the time of the dictation on 12/27/2022 IMPRESSION: Intraoperative localization image of the lumbar spine Electronically Signed   By: Karen Kays M.D.   On: 12/27/2022 14:39   DG Lumbar Spine 2-3 Views  Result Date: 12/27/2022 CLINICAL DATA:  Intraoperative localization film EXAM: LUMBAR SPINE - 2-3 VIEW COMPARISON:  01/04/2011 FINDINGS: Two cross-table lateral views are obtained during the  performance of procedure and are provided for interpretation only. On the first image, surgical instruments are seen overlying the L4 and L5 spinous processes. On the second image, there is surgical instrumentation overlying the L5 spinous process as well as the L5 level of the central canal. IMPRESSION: 1. Intraoperative localization images as above. These results were called by telephone at the time of interpretation on 12/27/2022 at 2:26 pm to provider [facility] Taher Vannote in the operating room, who verbally acknowledged these results. Electronically Signed   By: Sharlet Salina M.D.   On: 12/27/2022 14:27    Discharge Instructions     Incentive spirometry RT   Complete by: As directed         Follow-up Information     Venita Lick, MD. Schedule an appointment as soon as possible for a visit in 2 week(s).   Specialty: Orthopedic Surgery Why: If symptoms worsen, For wound re-check, For suture removal Contact information: [address] [place] [address] [phone]                 Discharge Plan:  discharge to home  Disposition: Kevin Stafford is doing exceptionally well this morning.  He is neurologically intact, with a negative straight leg raise test.  He is voiding spontaneously and tolerating a regular diet.  He has been ambulating with no radicular leg pain and when he stands up straight without he does not have any radicular leg pain.  Patient had minimal output over  the last 8 hours from the drain and so we have pulled out.  The incision site is clean dry and intact.  We will plan on discharge to home and he will restart his Xarelto tomorrow evening.  Patient was discharged with Percocet, Zofran, and Robaxin.  All appropriate instructions were given to the patient and he will follow-up with me in 2 weeks for reevaluation.     Signed: Alvy Beal for Dr. Venita Lick [facility] [phone] 12/28/2022, 8:01 AM    ,

## 2022-12-28 NOTE — Care Management (Signed)
Patient with order to DC to home today. Unit staff to provide DME needed for home.   No HH needs identified Patient will have family/ friends provide transportation home. No other TOC needs identified for DC 

## 2022-12-28 NOTE — Anesthesia Postprocedure Evaluation (Signed)
Anesthesia Post Note  Patient: Kevin Stafford  Procedure(s) Performed: Cyst excision left LUMBAR FOUR -FIVE (Left)     Patient location during evaluation: PACU Anesthesia Type: General Level of consciousness: awake and alert Pain management: pain level controlled Vital Signs Assessment: post-procedure vital signs reviewed and stable Respiratory status: spontaneous breathing, nonlabored ventilation, respiratory function stable and patient connected to nasal cannula oxygen Cardiovascular status: blood pressure returned to baseline and stable Postop Assessment: no apparent nausea or vomiting Anesthetic complications: no   No notable events documented.  Last Vitals:  Vitals:   12/28/22 0439 12/28/22 0727  BP: 108/61 121/73  Pulse: 79 76  Resp: 18 16  Temp: 36.8 C 36.7 C  SpO2: 93% 95%    Last Pain:  Vitals:   12/28/22 1011  TempSrc:   PainSc: 3                  [name]

## 2022-12-28 NOTE — Evaluation (Signed)
Physical Therapy Evaluation and Discharge  Patient Details Name: Kevin Stafford MRN: [id number] DOB: 10-04-1945 Today's Date: 12/28/2022  History of Present Illness  Pt is a 77 y/o male who underwent Cyst excision left LUMBAR FOUR -FIVE 4/29. PMHx: arthritis, urostomy, HTN, prostate cancer   Clinical Impression  Patient evaluated by Physical Therapy with no further acute PT needs identified. All education has been completed and the patient has no further questions. Pt was able to demonstrate transfers and ambulation with gross supervision to modified independence and no AD. Pt was educated on precautions, positioning recommendations, appropriate activity progression, and car transfer. See below for any follow-up Physical Therapy or equipment needs. PT is signing off. Thank you for this referral.        Recommendations for follow up therapy are one component of a multi-disciplinary discharge planning process, led by the attending physician.  Recommendations may be updated based on patient status, additional functional criteria and insurance authorization.  Follow Up Recommendations       Assistance Recommended at Discharge PRN  Patient can return home with the following  A little help with walking and/or transfers;A little help with bathing/dressing/bathroom;Assistance with cooking/housework;Assist for transportation;Help with stairs or ramp for entrance    Equipment Recommendations None recommended by PT  Recommendations for Other Services       Functional Status Assessment Patient has had a recent decline in their functional status and demonstrates the ability to make significant improvements in function in a reasonable and predictable amount of time.     Precautions / Restrictions Precautions Precautions: Fall;Back Precaution Booklet Issued: Yes (comment) Precaution Comments: Reviewed handout and pt was cued for precautions during functional mobility Required Braces or Orthoses:  Spinal Brace Spinal Brace: Lumbar corset;Applied in sitting position Restrictions Weight Bearing Restrictions: No      Mobility  Bed Mobility Overal bed mobility: Needs Assistance Bed Mobility: Rolling, Sidelying to Sit, Sit to Sidelying Rolling: Supervision Sidelying to sit: Supervision     Sit to sidelying: Supervision General bed mobility comments: VC's for log roll throughout. Pt with poor recall of log roll when returning to supine at end of session.    Transfers Overall transfer level: Needs assistance Equipment used: None Transfers: Sit to/from Stand Sit to Stand: Supervision           General transfer comment: VC's for improved posture. Pt demonstrated good hand placement on seated surface for safety.    Ambulation/Gait Ambulation/Gait assistance: Min guard Gait Distance (Feet): 560 Feet Assistive device: None Gait Pattern/deviations: Step-through pattern, Decreased stride length, Trunk flexed Gait velocity: Decreased Gait velocity interpretation: <1.31 ft/sec, indicative of household ambulator   General Gait Details: VC's throughout for improved posture and forward gaze. No overt LOB noted. Pt was able to improve gait speed significantly by end of gait training.  Stairs Stairs: Yes Stairs assistance: Min guard Stair Management: One rail Right, Step to pattern, Forwards Number of Stairs: 10 General stair comments: VC's for sequencing and general safety  Wheelchair Mobility    Modified Rankin (Stroke Patients Only)       Balance Overall balance assessment: Mild deficits observed, not formally tested                                           Pertinent Vitals/Pain Pain Assessment Pain Assessment: Faces Faces Pain Scale: Hurts a little bit Pain Location: sx site  Pain Descriptors / Indicators: Discomfort Pain Intervention(s): Limited activity within patient's tolerance, Monitored during session, Repositioned    Home Living  Family/patient expects to be discharged to:: Private residence Living Arrangements: Spouse/significant other Available Help at Discharge: Family;Available 24 hours/day Type of Home: House Home Access: Level entry     Alternate Level Stairs-Number of Steps: flight Home Layout: Two level;Able to live on main level with bedroom/bathroom Home Equipment: Rolling Walker (2 wheels);BSC/3in1      Prior Function Prior Level of Function : Independent/Modified Independent;Driving             Mobility Comments: no AD, enjoys golfing. Former Print production planner ADLs Comments: indep, med rep     Hand Dominance   Dominant Hand: Right    Extremity/Trunk Assessment   Upper Extremity Assessment Upper Extremity Assessment: Defer to OT evaluation    Lower Extremity Assessment Lower Extremity Assessment: Generalized weakness    Cervical / Trunk Assessment Cervical / Trunk Assessment: Back Surgery  Communication   Communication: No difficulties  Cognition Arousal/Alertness: Awake/alert Behavior During Therapy: WFL for tasks assessed/performed Overall Cognitive Status: Within Functional Limits for tasks assessed                                          General Comments General comments (skin integrity, edema, etc.): VSS on RA    Exercises     Assessment/Plan    PT Assessment Patient does not need any further PT services  PT Problem List Decreased strength;Decreased activity tolerance;Decreased balance;Decreased mobility;Decreased knowledge of use of DME;Decreased safety awareness;Decreased knowledge of precautions;Pain       PT Treatment Interventions      PT Goals (Current goals can be found in the Care Plan section)  Acute Rehab PT Goals Patient Stated Goal: Home today, back to golf and weight lifting PT Goal Formulation: All assessment and education complete, DC therapy    Frequency       Co-evaluation               AM-PAC PT "6 Clicks"  Mobility  Outcome Measure Help needed turning from your back to your side while in a flat bed without using bedrails?: A Little Help needed moving from lying on your back to sitting on the side of a flat bed without using bedrails?: A Little Help needed moving to and from a bed to a chair (including a wheelchair)?: A Little Help needed standing up from a chair using your arms (e.g., wheelchair or bedside chair)?: None Help needed to walk in hospital room?: A Little Help needed climbing 3-5 steps with a railing? : A Little 6 Click Score: 19    End of Session Equipment Utilized During Treatment: Gait belt Activity Tolerance: Patient tolerated treatment well Patient left: in bed;with call bell/phone within reach Nurse Communication: Mobility status PT Visit Diagnosis: Unsteadiness on feet (R26.81);Pain Pain - part of body:  (back)    Time: 8119-1478 PT Time Calculation (min) (ACUTE ONLY): 15 min   Charges:   PT Evaluation $PT Eval Low Complexity: 1 Low          Conni Slipper, PT, DPT Acute Rehabilitation Services Secure Chat Preferred Office: [phone]   Marylynn Pearson 12/28/2022, 11:51 AM

## 2022-12-28 NOTE — Progress Notes (Signed)
Patient alert and oriented, voiding adequately, MAE well with no difficulty. Incision area cdi with no s/s of infection. Patient discharged home per order. Patient and wife stated understanding of discharge instructions given. Patient has an appointment with Dr. Shon Baton in 2 weeks

## 2022-12-28 NOTE — Evaluation (Signed)
Occupational Therapy Evaluation Patient Details Name: Kevin Stafford MRN: [id number] DOB: 1946-01-03 Today's Date: 12/28/2022   History of Present Illness Kevin Stafford isa  77 yo male who underwent Cyst excision left LUMBAR FOUR -FIVE 4/29. PMHx: arthritis, urostomy, HTN, prostate cancer   Clinical Impression   Kevin Stafford was evaluated s/p the above spine surgery. He is active and indep at baseline. Upon evaluation he was limited by back pain, precautions and decreased activity tolerance. Overall he needed supervision A for transfers and mobility without AD and up to min G for LB ADLs. Provided cues and education on spinal precautions and compensatory techniques throughout, handout provided and pt demonstrated great recall. Pt does not require further acute OT services. Recommend d/c home with support of family.        Recommendations for follow up therapy are one component of a multi-disciplinary discharge planning process, led by the attending physician.  Recommendations may be updated based on patient status, additional functional criteria and insurance authorization.   Assistance Recommended at Discharge Intermittent Supervision/Assistance  Patient can return home with the following A little help with walking and/or transfers;A little help with bathing/dressing/bathroom;Assistance with cooking/housework;Assist for transportation;Help with stairs or ramp for entrance    Functional Status Assessment  Patient has had a recent decline in their functional status and demonstrates the ability to make significant improvements in function in a reasonable and predictable amount of time.  Equipment Recommendations  None recommended by OT       Precautions / Restrictions Precautions Precautions: Fall;Back Precaution Booklet Issued: Yes (comment) Required Braces or Orthoses: Spinal Brace Spinal Brace: Lumbar corset;Applied in sitting position Restrictions Weight Bearing Restrictions: No       Mobility Bed Mobility Overal bed mobility: Needs Assistance Bed Mobility: Rolling, Sidelying to Sit Rolling: Supervision Sidelying to sit: Supervision       General bed mobility comments: cue for log roll    Transfers Overall transfer level: Needs assistance Equipment used: None Transfers: Sit to/from Stand Sit to Stand: Supervision                  Balance Overall balance assessment: Mild deficits observed, not formally tested           ADL either performed or assessed with clinical judgement   ADL Overall ADL's : Needs assistance/impaired Eating/Feeding: Independent   Grooming: Supervision/safety;Standing   Upper Body Bathing: Set up;Sitting   Lower Body Bathing: Supervison/ safety;Cueing for compensatory techniques;Sit to/from stand   Upper Body Dressing : Set up;Sitting   Lower Body Dressing: Supervision/safety;Cueing for compensatory techniques;Sit to/from stand   Toilet Transfer: Supervision/safety;Ambulation   Toileting- Clothing Manipulation and Hygiene: Supervision/safety;Sitting/lateral lean       Functional mobility during ADLs: Supervision/safety General ADL Comments: no AD needed, cues for compensatory techniques     Vision Baseline Vision/History: 0 No visual deficits Vision Assessment?: No apparent visual deficits     Perception Perception Perception Tested?: No   Praxis Praxis Praxis tested?: Within functional limits    Pertinent Vitals/Pain Pain Assessment Pain Assessment: Faces Faces Pain Scale: Hurts a little bit Pain Location: sx site Pain Descriptors / Indicators: Discomfort Pain Intervention(s): Limited activity within patient's tolerance, Monitored during session     Hand Dominance Right   Extremity/Trunk Assessment Upper Extremity Assessment Upper Extremity Assessment: Overall WFL for tasks assessed   Lower Extremity Assessment Lower Extremity Assessment: Overall WFL for tasks assessed   Cervical / Trunk  Assessment Cervical / Trunk Assessment: Back Surgery  Communication Communication Communication: No difficulties   Cognition Arousal/Alertness: Awake/alert Behavior During Therapy: WFL for tasks assessed/performed Overall Cognitive Status: Within Functional Limits for tasks assessed                                 General Comments: great recall of precautions     General Comments  VSS on RA            Home Living Family/patient expects to be discharged to:: Private residence Living Arrangements: Spouse/significant other Available Help at Discharge: Family;Available 24 hours/day Type of Home: House Home Access: Level entry     Home Layout: Two level;Able to live on main level with bedroom/bathroom Alternate Level Stairs-Number of Steps: flight   Bathroom Shower/Tub: Producer, television/film/video: Standard     Home Equipment: Agricultural consultant (2 wheels);BSC/3in1          Prior Functioning/Environment Prior Level of Function : Independent/Modified Independent;Driving             Mobility Comments: no AD, enjoys golfing. Former Print production planner ADLs Comments: indep, med rep        OT Problem List: Decreased range of motion;Decreased activity tolerance;Impaired balance (sitting and/or standing);Decreased safety awareness;Decreased knowledge of precautions         OT Goals(Current goals can be found in the care plan section) Acute Rehab OT Goals Patient Stated Goal: home OT Goal Formulation: With patient Time For Goal Achievement: 12/28/22 Potential to Achieve Goals: Good   AM-PAC OT "6 Clicks" Daily Activity     Outcome Measure Help from another person eating meals?: None Help from another person taking care of personal grooming?: A Little Help from another person toileting, which includes using toliet, bedpan, or urinal?: A Little Help from another person bathing (including washing, rinsing, drying)?: A Little Help from another person  to put on and taking off regular upper body clothing?: None Help from another person to put on and taking off regular lower body clothing?: A Little 6 Click Score: 20   End of Session Equipment Utilized During Treatment: Back brace Nurse Communication: Mobility status  Activity Tolerance: Patient tolerated treatment well Patient left: in bed;with call bell/phone within reach  OT Visit Diagnosis: Unsteadiness on feet (R26.81);Other abnormalities of gait and mobility (R26.89);Muscle weakness (generalized) (M62.81)                Time: 1610-9604 OT Time Calculation (min): 19 min Charges:  OT General Charges $OT Visit: 1 Visit OT Evaluation $OT Eval Low Complexity: 1 Low  Derenda Mis, OTR/L Acute Rehabilitation Services Office [phone] Secure Chat Communication Preferred   Donia Pounds 12/28/2022, 9:46 AM

## 2022-12-29 LAB — SURGICAL PATHOLOGY
